# Patient Record
Sex: Female | Born: 1950 | Race: White | Hispanic: No | Marital: Married | Smoking: Never smoker
Health system: Southern US, Community
[De-identification: ages and names within clinical notes are randomized; demographics above are authoritative.]

## PROBLEM LIST (undated history)

## (undated) ENCOUNTER — Ambulatory Visit: Admission: EM | Source: Home / Self Care

## (undated) DIAGNOSIS — M1990 Unspecified osteoarthritis, unspecified site: Secondary | ICD-10-CM

## (undated) DIAGNOSIS — B1920 Unspecified viral hepatitis C without hepatic coma: Secondary | ICD-10-CM

## (undated) DIAGNOSIS — D649 Anemia, unspecified: Secondary | ICD-10-CM

## (undated) DIAGNOSIS — S82001A Unspecified fracture of right patella, initial encounter for closed fracture: Secondary | ICD-10-CM

## (undated) DIAGNOSIS — J302 Other seasonal allergic rhinitis: Secondary | ICD-10-CM

## (undated) DIAGNOSIS — I10 Essential (primary) hypertension: Secondary | ICD-10-CM

## (undated) DIAGNOSIS — E785 Hyperlipidemia, unspecified: Secondary | ICD-10-CM

## (undated) DIAGNOSIS — Z5189 Encounter for other specified aftercare: Secondary | ICD-10-CM

## (undated) DIAGNOSIS — J189 Pneumonia, unspecified organism: Secondary | ICD-10-CM

## (undated) DIAGNOSIS — IMO0001 Reserved for inherently not codable concepts without codable children: Secondary | ICD-10-CM

## (undated) DIAGNOSIS — E079 Disorder of thyroid, unspecified: Secondary | ICD-10-CM

## (undated) DIAGNOSIS — S7291XA Unspecified fracture of right femur, initial encounter for closed fracture: Secondary | ICD-10-CM

## (undated) DIAGNOSIS — T7840XA Allergy, unspecified, initial encounter: Secondary | ICD-10-CM

## (undated) DIAGNOSIS — G709 Myoneural disorder, unspecified: Secondary | ICD-10-CM

## (undated) DIAGNOSIS — K219 Gastro-esophageal reflux disease without esophagitis: Secondary | ICD-10-CM

## (undated) HISTORY — DX: Encounter for other specified aftercare: Z51.89

## (undated) HISTORY — DX: Myoneural disorder, unspecified: G70.9

## (undated) HISTORY — PX: LIVER BIOPSY: SHX301

## (undated) HISTORY — PX: TUBAL LIGATION: SHX77

## (undated) HISTORY — DX: Allergy, unspecified, initial encounter: T78.40XA

## (undated) HISTORY — PX: PARTIAL HYSTERECTOMY: SHX80

## (undated) HISTORY — PX: FRACTURE SURGERY: SHX138

## (undated) HISTORY — DX: Essential (primary) hypertension: I10

## (undated) HISTORY — PX: APPENDECTOMY: SHX54

## (undated) HISTORY — DX: Reserved for inherently not codable concepts without codable children: IMO0001

## (undated) HISTORY — PX: CHOLECYSTECTOMY: SHX55

## (undated) HISTORY — DX: Anemia, unspecified: D64.9

## (undated) HISTORY — DX: Hyperlipidemia, unspecified: E78.5

## (undated) HISTORY — DX: Unspecified viral hepatitis C without hepatic coma: B19.20

## (undated) HISTORY — PX: TONSILLECTOMY AND ADENOIDECTOMY: SUR1326

## (undated) HISTORY — PX: OVARIAN CYST SURGERY: SHX726

## (undated) HISTORY — DX: Gastro-esophageal reflux disease without esophagitis: K21.9

## (undated) HISTORY — PX: OTHER SURGICAL HISTORY: SHX169

## (undated) HISTORY — DX: Unspecified osteoarthritis, unspecified site: M19.90

## (undated) HISTORY — DX: Disorder of thyroid, unspecified: E07.9

---
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
## 1990-09-11 DIAGNOSIS — B1920 Unspecified viral hepatitis C without hepatic coma: Secondary | ICD-10-CM

## 1990-09-11 HISTORY — DX: Unspecified viral hepatitis C without hepatic coma: B19.20

## 1999-05-24 ENCOUNTER — Emergency Department (HOSPITAL_COMMUNITY): Admission: EM | Admit: 1999-05-24 | Discharge: 1999-05-24 | Payer: Self-pay

## 1999-09-06 ENCOUNTER — Other Ambulatory Visit: Admission: RE | Admit: 1999-09-06 | Discharge: 1999-09-06 | Payer: Self-pay | Admitting: General Practice

## 1999-11-13 HISTORY — PX: SPINE SURGERY: SHX786

## 2000-04-19 ENCOUNTER — Ambulatory Visit (HOSPITAL_COMMUNITY): Admission: RE | Admit: 2000-04-19 | Discharge: 2000-04-19 | Payer: Self-pay | Admitting: Gastroenterology

## 2001-05-23 ENCOUNTER — Ambulatory Visit (HOSPITAL_BASED_OUTPATIENT_CLINIC_OR_DEPARTMENT_OTHER): Admission: RE | Admit: 2001-05-23 | Discharge: 2001-05-23 | Payer: Self-pay | Admitting: Surgery

## 2001-08-20 ENCOUNTER — Ambulatory Visit (HOSPITAL_COMMUNITY): Admission: RE | Admit: 2001-08-20 | Discharge: 2001-08-20 | Payer: Self-pay | Admitting: Gastroenterology

## 2002-07-25 ENCOUNTER — Encounter: Payer: Self-pay | Admitting: Emergency Medicine

## 2002-07-25 ENCOUNTER — Emergency Department (HOSPITAL_COMMUNITY): Admission: EM | Admit: 2002-07-25 | Discharge: 2002-07-25 | Payer: Self-pay | Admitting: Emergency Medicine

## 2003-08-25 ENCOUNTER — Encounter: Admission: RE | Admit: 2003-08-25 | Discharge: 2003-08-25 | Payer: Self-pay | Admitting: Internal Medicine

## 2005-03-15 ENCOUNTER — Ambulatory Visit (HOSPITAL_COMMUNITY): Admission: RE | Admit: 2005-03-15 | Discharge: 2005-03-16 | Payer: Self-pay | Admitting: Neurosurgery

## 2005-09-28 ENCOUNTER — Encounter: Admission: RE | Admit: 2005-09-28 | Discharge: 2005-12-27 | Payer: Self-pay | Admitting: Neurosurgery

## 2006-01-12 ENCOUNTER — Ambulatory Visit (HOSPITAL_COMMUNITY): Admission: RE | Admit: 2006-01-12 | Discharge: 2006-01-12 | Payer: Self-pay | Admitting: Neurosurgery

## 2010-10-02 ENCOUNTER — Encounter: Payer: Self-pay | Admitting: Neurosurgery

## 2011-02-09 ENCOUNTER — Encounter: Payer: Self-pay | Admitting: Internal Medicine

## 2011-02-09 ENCOUNTER — Ambulatory Visit (INDEPENDENT_AMBULATORY_CARE_PROVIDER_SITE_OTHER): Payer: BC Managed Care – PPO | Admitting: Internal Medicine

## 2011-02-09 VITALS — BP 130/88 | HR 68 | Temp 97.9°F | Ht 63.0 in | Wt 199.0 lb

## 2011-02-09 DIAGNOSIS — R05 Cough: Secondary | ICD-10-CM

## 2011-02-09 MED ORDER — OMEPRAZOLE-SODIUM BICARBONATE 20-1100 MG PO CAPS: 1.0000 | ORAL_CAPSULE | Freq: Every day | ORAL | Status: DC

## 2011-02-09 MED ORDER — BECLOMETHASONE DIPROPIONATE 80 MCG/ACT IN AERS: 1.0000 | INHALATION_SPRAY | Freq: Two times a day (BID) | RESPIRATORY_TRACT | Status: DC

## 2011-02-09 NOTE — Progress Notes (Signed)
Subjective:    Patient ID: Suzanne Barton, female    DOB: [birth year], 60 y.o.   MRN: [id number]  Cough This is a new (60 year old obese female. ) problem. The current episode started more than 1 month ago (In Dec 2011 son got sick and she got sick with cold, cough but states cough never went away affter that). The problem has been gradually worsening (By March 2012 cough wsa getting worse, she was coughing all the time. In March 2012 zpak tried but no help. In April 2012, 4 day pred burst and expectt  but no better. Later in April - Rx with biazxin & tessalon perles. Early May 2012: 10 d pred & avelox ). The problem occurs constantly (Cough slowly getting better this past week). The cough is non-productive (dry, hacking cough, severe). Pertinent negatives include no chest pain, chills, ear congestion, ear pain, eye redness, fever, headaches, heartburn, hemoptysis, myalgias, postnasal drip, rash, rhinorrhea, sore throat, shortness of breath, sweats, weight loss or wheezing. Associated symptoms comments: [facility] April 2012 [facility] normal per hx. No associated ACE inhibitor intake. Associated "constant tickle" in throat present but denies dyspnea, heartburn, wheezing, chest tightness, post nasal drip or constant clearing of throat. Hx of neck sx 2004 and hx of spasms on and off in neck +. The symptoms are aggravated by fumes (Feeling hot, hot environment makes cough worse. Cold air does not bother cough. Has spring allergy but this is not making cough worse). She has tried oral steroids, OTC cough suppressant, prescription cough suppressant and rest for the symptoms. The treatment provided mild (Cough somewhat better past 2 days) relief. Her past medical history is significant for asthma and environmental allergies. There is no history of bronchiectasis, bronchitis, COPD, emphysema or pneumonia. allergic tree pollen but this is not causing cough,. Has hx of hiatal hernia but states she follows dietary  monitoring and portion control. Denies post nasal drip or sinus  issues. Known Hep C  - s/p IFN and Ribarvarin  early 2002      Review of Systems  Constitutional: Negative for fever, chills, weight loss and unexpected weight change.  HENT: Negative for ear pain, nosebleeds, congestion, sore throat, rhinorrhea, sneezing, trouble swallowing, dental problem, postnasal drip and sinus pressure.   Eyes: Negative for redness and itching.  Respiratory: Positive for cough. Negative for hemoptysis, chest tightness, shortness of breath and wheezing.   Cardiovascular: Negative for chest pain, palpitations and leg swelling.  Gastrointestinal: Negative for heartburn, nausea and vomiting.  Genitourinary: Negative for dysuria.  Musculoskeletal: Negative for myalgias and joint swelling.  Skin: Negative for rash.  Neurological: Negative for headaches.  Hematological: Positive for environmental allergies. Does not bruise/bleed easily.  Psychiatric/Behavioral: Negative for dysphoric mood. The patient is not nervous/anxious.        Objective:   Physical Exam  [vitalsreviewed. Constitutional: She is oriented to person, place, and time. She appears well-developed and well-nourished. No distress.       obese  HENT:  Head: Normocephalic and atraumatic.  Right Ear: External ear normal.  Left Ear: External ear normal.  Mouth/Throat: Oropharynx is clear and moist. No oropharyngeal exudate.  Eyes: Conjunctivae and EOM are normal. Pupils are equal, round, and reactive to light. Right eye exhibits no discharge. Left eye exhibits no discharge. No scleral icterus.  Neck: Normal range of motion. Neck supple. No JVD present. No tracheal deviation present. No thyromegaly present.  Cardiovascular: Normal rate, regular rhythm, normal heart sounds and intact distal  pulses.  Exam reveals no gallop and no friction rub.   No murmur heard. Pulmonary/Chest: Effort normal and breath sounds normal. No respiratory distress. She  has no wheezes. She has no rales. She exhibits no tenderness.  Abdominal: Soft. Bowel sounds are normal. She exhibits no distension and no mass. There is no tenderness. There is no rebound and no guarding.  Musculoskeletal: Normal range of motion. She exhibits no edema and no tenderness.  Lymphadenopathy:    She has no cervical adenopathy.  Neurological: She is alert and oriented to person, place, and time. She has normal reflexes. No cranial nerve deficit. She exhibits normal muscle tone. Coordination normal.  Skin: Skin is warm and dry. No rash noted. She is not diaphoretic. No erythema. No pallor.  Psychiatric: She has a normal mood and affect. Her behavior is normal. Judgment and thought content normal.          Assessment & Plan:

## 2011-02-09 NOTE — Patient Instructions (Addendum)
You cough is possibly  from acid reflux, sinus drainage, and possibly asthma # for acid reflux  - take diet sheet  - take otc zegerid 20mg  once daily on empty stomach; my nurse will do the order for this #for possible sinus   - use netti pot; Victorino Dike will show you how it works #possible asthma  - take qvar 2 puff bid - take 2 samples, learn technique #Followup  - 4-5 weeks

## 2011-02-13 DIAGNOSIS — R053 Chronic cough: Secondary | ICD-10-CM | POA: Insufficient documentation

## 2011-02-13 DIAGNOSIS — R05 Cough: Secondary | ICD-10-CM | POA: Insufficient documentation

## 2011-02-13 NOTE — Assessment & Plan Note (Signed)
You cough is possibly  from acid reflux, sinus drainage, and possibly asthma # for acid reflux  - take diet sheet  - take otc zegerid 20mg  once daily on empty stomach; my nurse will do the order for this #for possible sinus   - use netti pot; Victorino Dike will show you how it works #possible asthma (she prefers empiric RX as opposed to testing)  - take qvar 2 puff bid - take 2 samples, learn technique #Followup  - 4-5 weeks

## 2011-03-13 ENCOUNTER — Ambulatory Visit: Payer: BC Managed Care – PPO | Admitting: Internal Medicine

## 2012-02-20 ENCOUNTER — Encounter: Payer: Self-pay | Admitting: Internal Medicine

## 2012-02-27 ENCOUNTER — Encounter (INDEPENDENT_AMBULATORY_CARE_PROVIDER_SITE_OTHER): Payer: Self-pay

## 2012-03-08 ENCOUNTER — Ambulatory Visit (AMBULATORY_SURGERY_CENTER): Payer: BC Managed Care – PPO | Admitting: *Deleted

## 2012-03-08 ENCOUNTER — Encounter: Payer: Self-pay | Admitting: Internal Medicine

## 2012-03-08 VITALS — Ht 62.5 in | Wt 196.4 lb

## 2012-03-08 DIAGNOSIS — Z1211 Encounter for screening for malignant neoplasm of colon: Secondary | ICD-10-CM

## 2012-03-08 MED ORDER — MOVIPREP 100 G PO SOLR: ORAL | Status: DC

## 2012-03-08 NOTE — Progress Notes (Signed)
No allergies to eggs or soy products 

## 2012-03-19 NOTE — Addendum Note (Signed)
Addended by: Maple Hudson on: 03/19/2012 11:29 AM   Modules accepted: Level of Service

## 2012-03-21 ENCOUNTER — Ambulatory Visit (AMBULATORY_SURGERY_CENTER): Payer: BC Managed Care – PPO | Admitting: Internal Medicine

## 2012-03-21 ENCOUNTER — Encounter: Payer: Self-pay | Admitting: Internal Medicine

## 2012-03-21 VITALS — BP 144/70 | HR 86 | Temp 96.5°F | Resp 20 | Ht 62.0 in | Wt 196.0 lb

## 2012-03-21 DIAGNOSIS — D126 Benign neoplasm of colon, unspecified: Secondary | ICD-10-CM

## 2012-03-21 DIAGNOSIS — Z1211 Encounter for screening for malignant neoplasm of colon: Secondary | ICD-10-CM

## 2012-03-21 MED ORDER — SODIUM CHLORIDE 0.9 % IV SOLN: 500.0000 mL | INTRAVENOUS | Status: DC

## 2012-03-21 NOTE — Patient Instructions (Addendum)

## 2012-03-21 NOTE — Progress Notes (Signed)
1211- heart rate down to 27.  Dr. Marina Goodell aware.  Robinul 0.2mg  IV given per Silverio Decamp CRNA.  BP 68/44.  HOB down and IVF wide open  1215- Heart rate 70, BP 107/55 now

## 2012-03-21 NOTE — Progress Notes (Signed)
Patient did not experience any of the following events: a burn prior to discharge; a fall within the facility; wrong site/side/patient/procedure/implant event; or a hospital transfer or hospital admission upon discharge from the facility. (G8907) Patient did not have preoperative order for IV antibiotic SSI prophylaxis. (G8918)  

## 2012-03-21 NOTE — Op Note (Signed)
[facility] [address] [place], [place]  [address]  COLONOSCOPY PROCEDURE REPORT  PATIENT:  Suzanne Barton, Suzanne Barton  MR#:  [id number] BIRTHDATE:  1951/04/29, 60 yrs. old  GENDER:  female ENDOSCOPIST:  Wilhemina Bonito. Eda Keys, MD REF. BY:  Screening / Recall PROCEDURE DATE:  03/21/2012 PROCEDURE:  Colonoscopy with snare polypectomy x 2 ASA CLASS:  Class II INDICATIONS:  Screening ; colon / ACBE 2004 negative. c/o "air pockets, cramps, change bowels x 2 yrs. ?IBS" MEDICATIONS:   MAC sedation, administered by CRNA, propofol (Diprivan) 250 mg IV  DESCRIPTION OF PROCEDURE:   After the risks benefits and alternatives of the procedure were thoroughly explained, informed consent was obtained.  Digital rectal exam was performed and revealed no abnormalities.   The LB CF-H180AL [id number] endoscope was introduced through the anus and advanced to the cecum, which was identified by both the appendix and ileocecal valve, without limitations.  The quality of the prep was excellent, using MoviPrep.  The instrument was then slowly withdrawn as the colon was fully examined. <<PROCEDUREIMAGES>>  FINDINGS:  Two diminutive polyps were found in the rectum and sigmoid colon  were snared without cautery. Retrieval was successful.  Otherwise normal colonoscopy without other polyps, masses, vascular ectasias, or inflammatory changes.   Retroflexed views in the rectum revealed internal hemorrhoids.    The time to cecum =  4:00  minutes. The scope was then withdrawn in  13:10 minutes from the cecum and the procedure completed.  COMPLICATIONS:  None  ENDOSCOPIC IMPRESSION: 1) Two polyps in the rectum and sigmoid colon - removed 2) Otherwise normal colonoscopy 3) Internal hemorrhoids  RECOMMENDATIONS: 1) Repeat colonoscopy in 5 years if polyp adenomatous; otherwise 10 years  ______________________________ Wilhemina Bonito. Eda Keys, MD  CC:  The Patient; Charleston Poot, MD  n. Rosalie Doctor:   Wilhemina Bonito. Eda Keys at 03/21/2012 12:34 PM  [place], Elease Hashimoto, [id number]

## 2012-03-22 ENCOUNTER — Telehealth: Payer: Self-pay | Admitting: *Deleted

## 2012-03-22 NOTE — Telephone Encounter (Signed)
  Follow up Call-  Call back number 03/21/2012  Post procedure Call Back phone  # [phone]  Permission to leave phone message Yes     Patient questions:  Do you have a fever, pain , or abdominal swelling? no Pain Score  0 *  Have you tolerated food without any problems? yes  Have you been able to return to your normal activities? yes  Do you have any questions about your discharge instructions: Diet   no Medications  no Follow up visit  no  Do you have questions or concerns about your Care? no  Actions: * If pain score is 4 or above: No action needed, pain <4.

## 2012-03-26 ENCOUNTER — Encounter: Payer: Self-pay | Admitting: Internal Medicine

## 2012-04-29 ENCOUNTER — Encounter (INDEPENDENT_AMBULATORY_CARE_PROVIDER_SITE_OTHER): Payer: Self-pay | Admitting: Surgery

## 2012-04-29 ENCOUNTER — Ambulatory Visit (INDEPENDENT_AMBULATORY_CARE_PROVIDER_SITE_OTHER): Payer: BC Managed Care – PPO | Admitting: Surgery

## 2012-04-29 VITALS — BP 160/78 | HR 71 | Temp 98.1°F | Ht 63.0 in | Wt 194.8 lb

## 2012-04-29 DIAGNOSIS — M79621 Pain in right upper arm: Secondary | ICD-10-CM

## 2012-04-29 DIAGNOSIS — M79609 Pain in unspecified limb: Secondary | ICD-10-CM

## 2012-04-29 NOTE — Patient Instructions (Signed)
We will arrange an ultrasound to look at the area in the right axilla that is causing discomfort

## 2012-04-29 NOTE — Progress Notes (Signed)
Chief complaint: Painful area in right axilla, question of a mass  History of present illness: I taken to lipomas all from this patient's axillary area but they were really distal towards the arm.Currently she feels an area over but lower down in the actual axilla. She says that when his aches and sometimes makes the upper-outer quadrant of her right breast ache. It is not clear that she can actually feel a mass. She mentioned this when she had her mammogram in June and they apparently did some extra views but did not see a mass. An ultrasound was not done.  Past history family history and review of systems are all in Epic and not redictated here. I have reviewed them.  Exam: Vital signs:BP 160/78  Pulse 71  Temp 98.1 F (36.7 C) (Temporal)  Ht 5\' 3"  (1.6 m)  Wt 194 lb 12.8 oz (88.361 kg)  BMI 34.51 kg/m2  SpO2 98% Gen.: Patient alert oriented healthy-appearing Axilla: She has some tenderness in the low axilla on the right. I do not feel a definite mass although there may be some lipomatous type changes. There is no evidence of adenopathy or any other significant pathology in the axilla.   Data reviewed:Mammogram report is noted.  Impression: Right axillary pain with question of a lipoma or other mass; Negative mammogram  Plan: I think the first it would be to do an ultrasound to see if there is a mass. A total hesitant to excise any of this tissue without knowing there is some mass even if it's just a lipoma. She thought that his oral lipoma of the left lung but she is somewhat symptomatic.  Once the ultrasound is done we can make further plans.

## 2012-10-11 ENCOUNTER — Encounter (INDEPENDENT_AMBULATORY_CARE_PROVIDER_SITE_OTHER): Payer: Self-pay

## 2015-02-24 ENCOUNTER — Encounter: Payer: Self-pay | Admitting: Internal Medicine

## 2016-02-10 ENCOUNTER — Inpatient Hospital Stay (HOSPITAL_COMMUNITY): Payer: BLUE CROSS/BLUE SHIELD | Admitting: Anesthesiology

## 2016-02-10 ENCOUNTER — Inpatient Hospital Stay (HOSPITAL_COMMUNITY): Payer: BLUE CROSS/BLUE SHIELD

## 2016-02-10 ENCOUNTER — Encounter (HOSPITAL_COMMUNITY)
Admission: EM | Disposition: A | Discharge status: Home Health | Payer: Self-pay | Source: Home / Self Care | Attending: Orthopedic Surgery

## 2016-02-10 ENCOUNTER — Encounter (HOSPITAL_COMMUNITY): Payer: Self-pay | Admitting: Emergency Medicine

## 2016-02-10 ENCOUNTER — Emergency Department (HOSPITAL_COMMUNITY): Payer: BLUE CROSS/BLUE SHIELD

## 2016-02-10 ENCOUNTER — Inpatient Hospital Stay (HOSPITAL_COMMUNITY)
Admission: EM | Admit: 2016-02-10 | Discharge: 2016-02-13 | DRG: 481 | Disposition: A | Discharge status: Home Health | Payer: BLUE CROSS/BLUE SHIELD | Attending: Orthopedic Surgery | Admitting: Orthopedic Surgery

## 2016-02-10 ENCOUNTER — Ambulatory Visit: Admit: 2016-02-10 | Payer: Self-pay | Admitting: Orthopedic Surgery

## 2016-02-10 DIAGNOSIS — Z801 Family history of malignant neoplasm of trachea, bronchus and lung: Secondary | ICD-10-CM

## 2016-02-10 DIAGNOSIS — Z888 Allergy status to other drugs, medicaments and biological substances status: Secondary | ICD-10-CM | POA: Diagnosis not present

## 2016-02-10 DIAGNOSIS — Y9301 Activity, walking, marching and hiking: Secondary | ICD-10-CM | POA: Diagnosis present

## 2016-02-10 DIAGNOSIS — K219 Gastro-esophageal reflux disease without esophagitis: Secondary | ICD-10-CM | POA: Diagnosis present

## 2016-02-10 DIAGNOSIS — Z881 Allergy status to other antibiotic agents status: Secondary | ICD-10-CM | POA: Diagnosis not present

## 2016-02-10 DIAGNOSIS — Z882 Allergy status to sulfonamides status: Secondary | ICD-10-CM

## 2016-02-10 DIAGNOSIS — W19XXXA Unspecified fall, initial encounter: Secondary | ICD-10-CM

## 2016-02-10 DIAGNOSIS — S72301A Unspecified fracture of shaft of right femur, initial encounter for closed fracture: Principal | ICD-10-CM | POA: Diagnosis present

## 2016-02-10 DIAGNOSIS — B182 Chronic viral hepatitis C: Secondary | ICD-10-CM | POA: Diagnosis present

## 2016-02-10 DIAGNOSIS — Z981 Arthrodesis status: Secondary | ICD-10-CM | POA: Diagnosis not present

## 2016-02-10 DIAGNOSIS — W108XXA Fall (on) (from) other stairs and steps, initial encounter: Secondary | ICD-10-CM | POA: Diagnosis present

## 2016-02-10 DIAGNOSIS — E785 Hyperlipidemia, unspecified: Secondary | ICD-10-CM | POA: Diagnosis present

## 2016-02-10 DIAGNOSIS — Z880 Allergy status to penicillin: Secondary | ICD-10-CM | POA: Diagnosis not present

## 2016-02-10 DIAGNOSIS — S7291XA Unspecified fracture of right femur, initial encounter for closed fracture: Secondary | ICD-10-CM

## 2016-02-10 DIAGNOSIS — D62 Acute posthemorrhagic anemia: Secondary | ICD-10-CM | POA: Diagnosis not present

## 2016-02-10 DIAGNOSIS — S72001A Fracture of unspecified part of neck of right femur, initial encounter for closed fracture: Secondary | ICD-10-CM

## 2016-02-10 DIAGNOSIS — Z419 Encounter for procedure for purposes other than remedying health state, unspecified: Secondary | ICD-10-CM

## 2016-02-10 HISTORY — PX: FEMUR IM NAIL: SHX1597

## 2016-02-10 HISTORY — DX: Unspecified fracture of right femur, initial encounter for closed fracture: S72.91XA

## 2016-02-10 LAB — CBC WITH DIFFERENTIAL/PLATELET
Basophils Absolute: 0 10*3/uL (ref 0.0–0.1)
Basophils Relative: 0 %
Eosinophils Absolute: 0.1 10*3/uL (ref 0.0–0.7)
Eosinophils Relative: 1 %
HEMATOCRIT: 37.5 % (ref 36.0–46.0)
HEMOGLOBIN: 12.3 g/dL (ref 12.0–15.0)
LYMPHS ABS: 1.2 10*3/uL (ref 0.7–4.0)
Lymphocytes Relative: 22 %
MCH: 28.9 pg (ref 26.0–34.0)
MCHC: 32.8 g/dL (ref 30.0–36.0)
MCV: 88 fL (ref 78.0–100.0)
MONO ABS: 0.4 10*3/uL (ref 0.1–1.0)
MONOS PCT: 7 %
NEUTROS PCT: 70 %
Neutro Abs: 3.7 10*3/uL (ref 1.7–7.7)
Platelets: 184 10*3/uL (ref 150–400)
RBC: 4.26 MIL/uL (ref 3.87–5.11)
RDW: 13.1 % (ref 11.5–15.5)
WBC: 5.3 10*3/uL (ref 4.0–10.5)

## 2016-02-10 LAB — BASIC METABOLIC PANEL
Anion gap: 6 (ref 5–15)
BUN: 6 mg/dL (ref 6–20)
CHLORIDE: 105 mmol/L (ref 101–111)
CO2: 28 mmol/L (ref 22–32)
Calcium: 9.1 mg/dL (ref 8.9–10.3)
Creatinine, Ser: 1.03 mg/dL — ABNORMAL HIGH (ref 0.44–1.00)
GFR calc Af Amer: >60 mL/min (ref >60)
GFR calc non Af Amer: 57 mL/min — ABNORMAL LOW (ref >60)
GLUCOSE: 130 mg/dL — AB (ref 65–99)
POTASSIUM: 3.8 mmol/L (ref 3.5–5.1)
SODIUM: 139 mmol/L (ref 135–145)

## 2016-02-10 SURGERY — INSERTION, INTRAMEDULLARY ROD, FEMUR
Anesthesia: Monitor Anesthesia Care | Site: Leg Upper | Laterality: Right

## 2016-02-10 MED ORDER — LACTATED RINGERS IV SOLN
INTRAVENOUS | Status: DC | PRN
  Administered 2016-02-10 (×3): via INTRAVENOUS

## 2016-02-10 MED ORDER — SENNA-DOCUSATE SODIUM 8.6-50 MG PO TABS: 2.0000 | ORAL_TABLET | Freq: Every day | ORAL | Status: DC

## 2016-02-10 MED ORDER — ACETAMINOPHEN 325 MG PO TABS: 650.0000 mg | ORAL_TABLET | Freq: Four times a day (QID) | ORAL | Status: DC | PRN

## 2016-02-10 MED ORDER — HYDROMORPHONE HCL 1 MG/ML IJ SOLN: 0.2500 mg | INTRAMUSCULAR | Status: DC | PRN

## 2016-02-10 MED ORDER — MECLIZINE HCL 25 MG PO TABS
25.0000 mg | ORAL_TABLET | Freq: Three times a day (TID) | ORAL | Status: DC | PRN
  Filled 2016-02-10: qty 1

## 2016-02-10 MED ORDER — ONDANSETRON HCL 4 MG/2ML IJ SOLN
4.0000 mg | Freq: Four times a day (QID) | INTRAMUSCULAR | Status: DC | PRN
  Administered 2016-02-11 – 2016-02-12 (×2): 4 mg via INTRAVENOUS
  Filled 2016-02-10 (×2): qty 2

## 2016-02-10 MED ORDER — HYDROMORPHONE HCL 1 MG/ML IJ SOLN: 1.0000 mg | INTRAMUSCULAR | Status: DC | PRN

## 2016-02-10 MED ORDER — ACETAMINOPHEN 650 MG RE SUPP: 650.0000 mg | Freq: Four times a day (QID) | RECTAL | Status: DC | PRN

## 2016-02-10 MED ORDER — SENNA 8.6 MG PO TABS
1.0000 | ORAL_TABLET | Freq: Two times a day (BID) | ORAL | Status: DC
  Administered 2016-02-10 – 2016-02-13 (×6): 8.6 mg via ORAL
  Filled 2016-02-10 (×6): qty 1

## 2016-02-10 MED ORDER — ONDANSETRON HCL 4 MG/2ML IJ SOLN
INTRAMUSCULAR | Status: DC | PRN
  Administered 2016-02-10: 4 mg via INTRAVENOUS

## 2016-02-10 MED ORDER — ACETAMINOPHEN 160 MG/5ML PO SOLN
325.0000 mg | ORAL | Status: DC | PRN
Start: 2016-02-10 — End: 2016-02-10
  Filled 2016-02-10: qty 20.3

## 2016-02-10 MED ORDER — DIPHENHYDRAMINE HCL (SLEEP) 25 MG PO TABS: 25.0000 mg | ORAL_TABLET | ORAL | Status: DC | PRN

## 2016-02-10 MED ORDER — PANTOPRAZOLE SODIUM 40 MG PO TBEC
40.0000 mg | DELAYED_RELEASE_TABLET | Freq: Every day | ORAL | Status: DC
  Administered 2016-02-11 – 2016-02-13 (×3): 40 mg via ORAL
  Filled 2016-02-10 (×3): qty 1

## 2016-02-10 MED ORDER — MAGNESIUM CITRATE PO SOLN: 1.0000 | Freq: Once | ORAL | Status: DC | PRN

## 2016-02-10 MED ORDER — CEFAZOLIN SODIUM-DEXTROSE 2-4 GM/100ML-% IV SOLN
INTRAVENOUS | Status: AC
  Administered 2016-02-10: 2 g via INTRAVENOUS
  Filled 2016-02-10: qty 100

## 2016-02-10 MED ORDER — PROPOFOL 500 MG/50ML IV EMUL
INTRAVENOUS | Status: DC | PRN
  Administered 2016-02-10: 50 ug/kg/min via INTRAVENOUS

## 2016-02-10 MED ORDER — CEFAZOLIN SODIUM-DEXTROSE 2-4 GM/100ML-% IV SOLN
2.0000 g | Freq: Four times a day (QID) | INTRAVENOUS | Status: AC
  Administered 2016-02-10 – 2016-02-11 (×2): 2 g via INTRAVENOUS
  Filled 2016-02-10 (×2): qty 100

## 2016-02-10 MED ORDER — 0.9 % SODIUM CHLORIDE (POUR BTL) OPTIME
TOPICAL | Status: DC | PRN
  Administered 2016-02-10: 1000 mL

## 2016-02-10 MED ORDER — FENTANYL CITRATE (PF) 100 MCG/2ML IJ SOLN: 100.0000 ug | Freq: Once | INTRAMUSCULAR | Status: DC

## 2016-02-10 MED ORDER — PHENYLEPHRINE HCL 10 MG/ML IJ SOLN
INTRAMUSCULAR | Status: DC | PRN
  Administered 2016-02-10 (×7): 80 ug via INTRAVENOUS

## 2016-02-10 MED ORDER — PROPOFOL 1000 MG/100ML IV EMUL
INTRAVENOUS | Status: AC
  Filled 2016-02-10: qty 100

## 2016-02-10 MED ORDER — TRAMADOL HCL 50 MG PO TABS
50.0000 mg | ORAL_TABLET | Freq: Four times a day (QID) | ORAL | Status: DC | PRN
  Administered 2016-02-11 – 2016-02-12 (×3): 50 mg via ORAL
  Filled 2016-02-10 (×3): qty 1

## 2016-02-10 MED ORDER — FERROUS SULFATE 325 (65 FE) MG PO TABS
325.0000 mg | ORAL_TABLET | Freq: Three times a day (TID) | ORAL | Status: DC
  Administered 2016-02-11 – 2016-02-13 (×7): 325 mg via ORAL
  Filled 2016-02-10 (×7): qty 1

## 2016-02-10 MED ORDER — ALUM & MAG HYDROXIDE-SIMETH 200-200-20 MG/5ML PO SUSP: 30.0000 mL | ORAL | Status: DC | PRN

## 2016-02-10 MED ORDER — ENOXAPARIN SODIUM 40 MG/0.4ML ~~LOC~~ SOLN
40.0000 mg | SUBCUTANEOUS | Status: DC
Start: 2016-02-11 — End: 2016-02-13
  Administered 2016-02-11 – 2016-02-13 (×3): 40 mg via SUBCUTANEOUS
  Filled 2016-02-10 (×3): qty 0.4

## 2016-02-10 MED ORDER — CYCLOBENZAPRINE HCL 10 MG PO TABS: 10.0000 mg | ORAL_TABLET | Freq: Three times a day (TID) | ORAL | Status: DC | PRN

## 2016-02-10 MED ORDER — OXYCODONE HCL 5 MG/5ML PO SOLN: 5.0000 mg | Freq: Once | ORAL | Status: DC | PRN

## 2016-02-10 MED ORDER — PROPOFOL 10 MG/ML IV BOLUS
INTRAVENOUS | Status: DC | PRN
  Administered 2016-02-10: 50 mg via INTRAVENOUS
  Administered 2016-02-10: 25 mg via INTRAVENOUS
  Administered 2016-02-10: 50 mg via INTRAVENOUS
  Administered 2016-02-10: 25 mg via INTRAVENOUS

## 2016-02-10 MED ORDER — PHENYLEPHRINE 40 MCG/ML (10ML) SYRINGE FOR IV PUSH (FOR BLOOD PRESSURE SUPPORT)
PREFILLED_SYRINGE | INTRAVENOUS | Status: AC
  Filled 2016-02-10: qty 10

## 2016-02-10 MED ORDER — HYDROMORPHONE HCL 1 MG/ML IJ SOLN: 0.5000 mg | INTRAMUSCULAR | Status: DC | PRN

## 2016-02-10 MED ORDER — HYDROCODONE-ACETAMINOPHEN 10-325 MG PO TABS: 1.0000 | ORAL_TABLET | Freq: Four times a day (QID) | ORAL | Status: DC | PRN

## 2016-02-10 MED ORDER — ONDANSETRON HCL 4 MG PO TABS: 4.0000 mg | ORAL_TABLET | Freq: Four times a day (QID) | ORAL | Status: DC | PRN

## 2016-02-10 MED ORDER — PHENOL 1.4 % MT LIQD
1.0000 | OROMUCOSAL | Status: DC | PRN
  Filled 2016-02-10: qty 177

## 2016-02-10 MED ORDER — EPHEDRINE 5 MG/ML INJ
INTRAVENOUS | Status: AC
  Filled 2016-02-10: qty 10

## 2016-02-10 MED ORDER — DIPHENHYDRAMINE HCL 25 MG PO CAPS: 25.0000 mg | ORAL_CAPSULE | Freq: Every evening | ORAL | Status: DC | PRN

## 2016-02-10 MED ORDER — SODIUM CHLORIDE 0.9 % IV SOLN
75.0000 mL/h | INTRAVENOUS | Status: DC
Start: 2016-02-10 — End: 2016-02-13

## 2016-02-10 MED ORDER — EPHEDRINE SULFATE 50 MG/ML IJ SOLN
INTRAMUSCULAR | Status: DC | PRN
  Administered 2016-02-10 (×3): 5 mg via INTRAVENOUS
  Administered 2016-02-10: 10 mg via INTRAVENOUS

## 2016-02-10 MED ORDER — MIDAZOLAM HCL 5 MG/5ML IJ SOLN
INTRAMUSCULAR | Status: DC | PRN
  Administered 2016-02-10: 2 mg via INTRAVENOUS

## 2016-02-10 MED ORDER — PROPOFOL 10 MG/ML IV BOLUS
INTRAVENOUS | Status: AC
  Filled 2016-02-10: qty 40

## 2016-02-10 MED ORDER — ACETAMINOPHEN 325 MG PO TABS
325.0000 mg | ORAL_TABLET | ORAL | Status: DC | PRN
Start: 2016-02-10 — End: 2016-02-10

## 2016-02-10 MED ORDER — DOCUSATE SODIUM 100 MG PO CAPS
100.0000 mg | ORAL_CAPSULE | Freq: Two times a day (BID) | ORAL | Status: DC
  Administered 2016-02-10 – 2016-02-13 (×6): 100 mg via ORAL
  Filled 2016-02-10 (×6): qty 1

## 2016-02-10 MED ORDER — BUPIVACAINE HCL (PF) 0.5 % IJ SOLN
INTRAMUSCULAR | Status: DC | PRN
  Administered 2016-02-10: 2.8 mL via INTRATHECAL

## 2016-02-10 MED ORDER — HYDROCODONE-ACETAMINOPHEN 5-325 MG PO TABS
1.0000 | ORAL_TABLET | Freq: Four times a day (QID) | ORAL | Status: DC | PRN
  Administered 2016-02-10 – 2016-02-13 (×6): 2 via ORAL
  Filled 2016-02-10 (×6): qty 2

## 2016-02-10 MED ORDER — BUDESONIDE 0.25 MG/2ML IN SUSP
0.2500 mg | Freq: Two times a day (BID) | RESPIRATORY_TRACT | Status: DC
  Administered 2016-02-11 – 2016-02-13 (×5): 0.25 mg via RESPIRATORY_TRACT
  Filled 2016-02-10 (×5): qty 2

## 2016-02-10 MED ORDER — SODIUM CHLORIDE 0.9 % IJ SOLN
INTRAMUSCULAR | Status: AC
  Filled 2016-02-10: qty 10

## 2016-02-10 MED ORDER — ALPRAZOLAM 0.25 MG PO TABS: 0.2500 mg | ORAL_TABLET | Freq: Every evening | ORAL | Status: DC | PRN

## 2016-02-10 MED ORDER — FENTANYL CITRATE (PF) 250 MCG/5ML IJ SOLN
INTRAMUSCULAR | Status: AC
  Filled 2016-02-10: qty 5

## 2016-02-10 MED ORDER — MENTHOL 3 MG MT LOZG
1.0000 | LOZENGE | OROMUCOSAL | Status: DC | PRN
  Filled 2016-02-10: qty 9

## 2016-02-10 MED ORDER — SODIUM CHLORIDE 0.9 % IV SOLN: INTRAVENOUS | Status: DC

## 2016-02-10 MED ORDER — BENZONATATE 100 MG PO CAPS
100.0000 mg | ORAL_CAPSULE | Freq: Three times a day (TID) | ORAL | Status: DC | PRN
  Administered 2016-02-10: 100 mg via ORAL
  Filled 2016-02-10 (×2): qty 1

## 2016-02-10 MED ORDER — ADULT MULTIVITAMIN W/MINERALS CH
1.0000 | ORAL_TABLET | Freq: Every day | ORAL | Status: DC
  Administered 2016-02-11 – 2016-02-13 (×3): 1 via ORAL
  Filled 2016-02-10 (×3): qty 1

## 2016-02-10 MED ORDER — ENOXAPARIN SODIUM 40 MG/0.4ML ~~LOC~~ SOLN
40.0000 mg | SUBCUTANEOUS | Status: DC
Start: 2016-02-10 — End: 2019-03-03

## 2016-02-10 MED ORDER — FENTANYL CITRATE (PF) 250 MCG/5ML IJ SOLN
INTRAMUSCULAR | Status: DC | PRN
  Administered 2016-02-10 (×2): 50 ug via INTRAVENOUS

## 2016-02-10 MED ORDER — ONDANSETRON HCL 4 MG/2ML IJ SOLN: 4.0000 mg | Freq: Three times a day (TID) | INTRAMUSCULAR | Status: DC | PRN

## 2016-02-10 MED ORDER — HYDROMORPHONE HCL 1 MG/ML IJ SOLN
1.0000 mg | Freq: Once | INTRAMUSCULAR | Status: AC
  Administered 2016-02-10: 1 mg via INTRAVENOUS
  Filled 2016-02-10: qty 1

## 2016-02-10 MED ORDER — OXYCODONE HCL 5 MG PO TABS: 5.0000 mg | ORAL_TABLET | Freq: Once | ORAL | Status: DC | PRN

## 2016-02-10 MED ORDER — BISACODYL 10 MG RE SUPP: 10.0000 mg | Freq: Every day | RECTAL | Status: DC | PRN

## 2016-02-10 MED ORDER — CYCLOBENZAPRINE HCL 10 MG PO TABS
10.0000 mg | ORAL_TABLET | Freq: Three times a day (TID) | ORAL | Status: DC | PRN
  Administered 2016-02-10 – 2016-02-13 (×4): 10 mg via ORAL
  Filled 2016-02-10 (×5): qty 1

## 2016-02-10 MED ORDER — POLYETHYLENE GLYCOL 3350 17 G PO PACK: 17.0000 g | PACK | Freq: Every day | ORAL | Status: DC | PRN

## 2016-02-10 MED ORDER — MIDAZOLAM HCL 2 MG/2ML IJ SOLN
INTRAMUSCULAR | Status: AC
  Filled 2016-02-10: qty 2

## 2016-02-10 SURGICAL SUPPLY — 44 items
BENZOIN TINCTURE PRP APPL 2/3 (GAUZE/BANDAGES/DRESSINGS) ×4 IMPLANT
BIT DRILL 4.2 (DRILL) ×2 IMPLANT
BLADE HELICAL TFNA 90 HIP (Anchor) ×3 IMPLANT
BLADE HELICAL TFNA 90MM HIP (Anchor) ×1 IMPLANT
BOOTCOVER CLEANROOM LRG (PROTECTIVE WEAR) ×8 IMPLANT
CLOSURE STERI-STRIP 1/2X4 (GAUZE/BANDAGES/DRESSINGS)
CLOSURE WOUND 1/2 X4 (GAUZE/BANDAGES/DRESSINGS) ×1
CLSR STERI-STRIP ANTIMIC 1/2X4 (GAUZE/BANDAGES/DRESSINGS) IMPLANT
COVER PERINEAL POST (MISCELLANEOUS) ×4 IMPLANT
COVER SURGICAL LIGHT HANDLE (MISCELLANEOUS) ×4 IMPLANT
DRAPE STERI IOBAN 125X83 (DRAPES) ×4 IMPLANT
DRILL 4.2 (DRILL) ×4
DRSG MEPILEX BORDER 4X4 (GAUZE/BANDAGES/DRESSINGS) ×4 IMPLANT
DRSG MEPILEX BORDER 4X8 (GAUZE/BANDAGES/DRESSINGS) IMPLANT
DURAPREP 26ML APPLICATOR (WOUND CARE) ×4 IMPLANT
ELECT CAUTERY BLADE 6.4 (BLADE) IMPLANT
ELECT REM PT RETURN 9FT ADLT (ELECTROSURGICAL) ×4
ELECTRODE REM PT RTRN 9FT ADLT (ELECTROSURGICAL) ×2 IMPLANT
EVACUATOR 1/8 PVC DRAIN (DRAIN) IMPLANT
FACESHIELD WRAPAROUND (MASK) ×8 IMPLANT
GAUZE XEROFORM 5X9 LF (GAUZE/BANDAGES/DRESSINGS) IMPLANT
GLOVE BIOGEL PI ORTHO PRO SZ8 (GLOVE) ×4
GLOVE ORTHO TXT STRL SZ7.5 (GLOVE) ×8 IMPLANT
GLOVE PI ORTHO PRO STRL SZ8 (GLOVE) ×4 IMPLANT
GLOVE SURG ORTHO 8.0 STRL STRW (GLOVE) ×8 IMPLANT
GOWN STRL REUS W/ TWL LRG LVL3 (GOWN DISPOSABLE) ×4 IMPLANT
GOWN STRL REUS W/TWL LRG LVL3 (GOWN DISPOSABLE) ×4
GUIDEWIRE 3.2X400 (WIRE) ×4 IMPLANT
KIT ROOM TURNOVER OR (KITS) ×4 IMPLANT
LINER BOOT UNIVERSAL DISP (MISCELLANEOUS) IMPLANT
MANIFOLD NEPTUNE II (INSTRUMENTS) ×4 IMPLANT
NAIL TI CANN 11MM 130D HIP (Nail) ×4 IMPLANT
NS IRRIG 1000ML POUR BTL (IV SOLUTION) ×4 IMPLANT
PACK GENERAL/GYN (CUSTOM PROCEDURE TRAY) ×4 IMPLANT
PAD ARMBOARD 7.5X6 YLW CONV (MISCELLANEOUS) ×12 IMPLANT
REAMER ROD DEEP FLUTE 2.5X950 (INSTRUMENTS) ×4 IMPLANT
SCREW LOCKING 5.0X48MM (Screw) ×4 IMPLANT
SCREW LOCKING 5.0X58 (Screw) ×4 IMPLANT
STRIP CLOSURE SKIN 1/2X4 (GAUZE/BANDAGES/DRESSINGS) ×3 IMPLANT
SUT VIC AB 0 CTB1 27 (SUTURE) ×4 IMPLANT
SUT VIC AB 3-0 SH 8-18 (SUTURE) ×4 IMPLANT
TOWEL OR 17X24 6PK STRL BLUE (TOWEL DISPOSABLE) ×4 IMPLANT
TOWEL OR 17X26 10 PK STRL BLUE (TOWEL DISPOSABLE) ×4 IMPLANT
WATER STERILE IRR 1000ML POUR (IV SOLUTION) ×4 IMPLANT

## 2016-02-10 NOTE — ED Notes (Signed)
Pants were brought back and belongings were found.

## 2016-02-10 NOTE — Op Note (Signed)
DATE OF SURGERY:  02/10/2016  TIME: 9:11 PM  PATIENT NAME:  Suzanne Barton  AGE: 65 y.o.  PRE-OPERATIVE DIAGNOSIS:  Right midshaft femur fracture  POST-OPERATIVE DIAGNOSIS:  SAME  PROCEDURE:  Placement of intramedullary nail, right femur fracture  SURGEON:  Nashya Garlington P  ASSISTANT:  Joya Gaskins, OPA-C, present and scrubbed throughout the case, critical for assistance with exposure, retraction, instrumentation, and closure.  OPERATIVE IMPLANTS: Synthes trochanteric femoral nail with interlocking helical blade into the femoral head with 2 distal interlocking bolts  UNIQUE ASPECTS OF THE CASE:  The bone quality was fairly sclerotic within the head, the fracture was actually fairly difficult to reduce on the lateral view. Additionally, the nail continued to try to find its way into the lateral femoral condyle, and it was somewhat challenging to get centered, but ultimately I did achieve it centered.  PREOPERATIVE INDICATIONS:  Suzanne Barton is a 65 y.o. year old who has previously had a femur fracture that underwent open reduction internal fixation back in the 1970s, and she was also treated in traction for a month apparently, and then more recently just the day twisted, felt her leg give way, and fell. She was brought into the ER and then admitted and optimized and then elected for surgical intervention.    The risks benefits and alternatives were discussed with the patient including but not limited to the risks of nonoperative treatment, versus surgical intervention including infection, bleeding, nerve injury, malunion, nonunion, hardware prominence, hardware failure, need for hardware removal, blood clots, cardiopulmonary complications, morbidity, mortality, among others, and they were willing to proceed.    OPERATIVE PROCEDURE:  The patient was brought to the operating room and placed in the supine position. Spinal was administered, with a foley. She was placed on the  fracture table.  Closed reduction was performed under C-arm guidance.  Time out was then performed after sterile prep and drape. She received preoperative antibiotics in the form of Ancef and tolerated it well. She also received nasal Betadine swab..  Incision was made proximal to the greater trochanter. A guidewire was placed in the appropriate position. Confirmation was made on AP and lateral views. The above-named nail was opened. I opened the proximal femur with a reamer. I then placed a guidewire across the fracture site, this was somewhat difficult, and I had to use the "finger" to get the wire across the fracture site. I then reamed sequentially, and placed the nail, the nail was directed somewhat more posteriorly, and I'm not sure if there was some pre-existing deformity to her femur, but we tried to reduce it as anatomically as possible although I could not achieve complete control over the fracture site without opening the fracture which I did not do.   Once the nail was completely seated, I placed a guidepin into the femoral head into the center center position. I measured the length, and then reamed the lateral cortex and up into the head. I then placed the helical blade. Bone quality was quite sclerotic in some places, and relatively poor in other places particularly in the mid shaft.  I then secured the proximal interlocking bolt completely, and then removed the instruments. I used perfect circles technique to place the distal 2 screws, locking the nail in rigid position.  I took final C-arm pictures AP and lateral the entire length of the leg. Anatomic reconstruction was achieved, and the wounds were irrigated copiously and closed with Vicryl followed by Steri-Strips and sterile gauze for the  skin. The patient was awakened and returned to PACU in stable and satisfactory condition. There no complications and the patient tolerated the procedure well.  She will be weightbearing as tolerated, and  will be on Lovenox  for a period of three weeks after discharge.   Marchia Bond, M.D.

## 2016-02-10 NOTE — ED Provider Notes (Signed)
CSN: [id number]     Arrival date & time 02/10/16  1513 History   First MD Initiated Contact with Patient 02/10/16 1516     Chief Complaint  Patient presents with  . Leg Injury     (Consider location/radiation/quality/duration/timing/severity/associated sxs/prior Treatment) Patient is a 65 y.o. female presenting with leg pain. The history is provided by the patient and medical records. No language interpreter was used.  Leg Pain Location:  Leg Injury: yes   Mechanism of injury comment:  Fell while walking downstairs, missed step and twisted right thigh laterally, fell to sitting position. Unable to walk afterward. Leg location:  R upper leg Pain details:    Quality:  Throbbing   Radiates to:  Does not radiate   Severity:  Severe   Onset quality:  Sudden   Timing:  Constant   Progression:  Unchanged Chronicity:  New Dislocation: no   Foreign body present:  No foreign bodies Relieved by:  Nothing Exacerbated by: movement. Ineffective treatments:  Rest Associated symptoms: decreased ROM and swelling   Associated symptoms: no fever, no muscle weakness and no neck pain   Risk factors comment:  Previous femur fx   Past Medical History  Diagnosis Date  . Hepatitis C 1992    in remission  . Allergy   . Anemia   . Blood transfusion   . Neuromuscular disorder     hiatal hernia  . Hyperlipidemia     no meds  . GERD (gastroesophageal reflux disease)    Past Surgical History  Procedure Laterality Date  . Ovarian cyst surgery    . Fracture surgery  1973    right femur  . Fracture surgery      left thumb  . Spine surgery  11-13-1999    vertebrae fusion  . Partial hysterectomy      had partial first, then later had all female organs removed  . Tubal ligation    . Appendectomy    . Cholecystectomy    . Knee arthoscopy      x3  . Cyst removed      x3- under right arm, x3- groin area- as a teenager  . Tonsillectomy and adenoidectomy    . Liver biopsy      x12   Family  History  Problem Relation Age of Onset  . Heart disease Father   . Heart failure Father   . Lung cancer Mother   . Cancer Mother     lung and brain  . COPD Sister   . Lung cancer Sister   . Colon cancer Neg Hx   . Esophageal cancer Neg Hx   . Stomach cancer Neg Hx   . Rectal cancer Neg Hx    Social History  Substance Use Topics  . Smoking status: Never Smoker   . Smokeless tobacco: Never Used     Comment: husband smoked x 13 yrs  . Alcohol Use: No   OB History    No data available     Review of Systems  Constitutional: Negative for fever and chills.  HENT: Negative for nosebleeds.   Eyes: Negative for visual disturbance.  Respiratory: Negative for cough and shortness of breath.   Cardiovascular: Negative for chest pain.  Gastrointestinal: Negative for nausea and vomiting.  Genitourinary: Negative for difficulty urinating.  Musculoskeletal: Positive for gait problem. Negative for neck pain.  Skin: Negative for wound.  Neurological: Negative for weakness, numbness and headaches.  Psychiatric/Behavioral: Negative for confusion.  Allergies  Tussionex pennkinetic er; Amoxicillin; Ibuprofen; and Sulfa antibiotics  Home Medications   Prior to Admission medications   Medication Sig Start Date End Date Taking? Authorizing Provider  beclomethasone (QVAR) 80 MCG/ACT inhaler Inhale 1 puff into the lungs 2 (two) times daily. 02/09/11   Brand Males, MD  diphenhydrAMINE (SOMINEX) 25 MG tablet Take 25 mg by mouth as needed.    Historical Provider, MD  Naproxen Sodium (ALEVE PO) Take by mouth as needed.    Historical Provider, MD  Pseudoeph-CPM-DM-APAP (ALKA-SELTZER PLUS COLD/COUGH) 30-2-10-250 MG CAPS Take 1 capsule by mouth as directed.      Historical Provider, MD   BP 168/65 mmHg  Pulse 72  Temp(Src) 97.8 F (36.6 C) (Oral)  Resp 16  SpO2 99% Physical Exam  Constitutional: She is oriented to person, place, and time. She appears well-developed and well-nourished.  No distress.  HENT:  Head: Normocephalic and atraumatic.  Eyes: EOM are normal. Pupils are equal, round, and reactive to light.  Neck: Normal range of motion. Neck supple.  No midline TTP  Cardiovascular: Normal rate, regular rhythm and intact distal pulses.   Pulmonary/Chest: Effort normal and breath sounds normal. No respiratory distress.  Abdominal: Soft. She exhibits no distension. There is no tenderness.  Musculoskeletal:  Right lower extremity is shortened about 2 cm compared with left. Right thigh with swelling, bruising and TTP. Right DP/PT pulses intact. RLE sensation intact. There is a well healed vertical surgical scar over right anterior thigh. Right knee without deformity, swelling, or TTP. ROM of right knee limited secondary to thigh pain. Right foot without deformity, swelling, or TTP. Able to perform full ROM of right ankle. No cervical, thoracic, or lumbar TTP. No stepoffs. LLE atraumatic. RUE, LUE atraumatic. Pelvis stable to anterior/lateral compression.   Neurological: She is alert and oriented to person, place, and time.  Skin: Skin is warm and dry. No rash noted.  Psychiatric: She has a normal mood and affect.  Nursing note and vitals reviewed.   ED Course  Procedures (including critical care time) Labs Review Labs Reviewed  BASIC METABOLIC PANEL - Abnormal; Notable for the following:    Glucose, Bld 130 (*)    Creatinine, Ser 1.03 (*)    GFR calc non Af Amer 57 (*)    All other components within normal limits  CBC WITH DIFFERENTIAL/PLATELET  CBC  BASIC METABOLIC PANEL    Imaging Review  Dg Pelvis 1-2 Views  02/10/2016  CLINICAL DATA:  Fall today.  Pelvic pain. EXAM: PELVIS - 1-2 VIEW COMPARISON:  None. FINDINGS: There is no evidence of pelvic fracture or diastasis. No pelvic bone lesions are seen. Mild to moderate bilateral hip osteoarthritis noted as well as lower lumbar spine degenerative changes. IMPRESSION: No evidence of pelvic fracture. Electronically  Signed   By: Earle Gell M.D.   On: 02/10/2016 16:29   Dg Femur, Min 2 Views Right  02/10/2016  CLINICAL DATA:  Fall today. Right femur pain and fracture deformity. Initial encounter. EXAM: RIGHT FEMUR 2 VIEWS COMPARISON:  None. FINDINGS: A mildly comminuted oblique fracture of the mid femoral diaphysis is seen with medial and posterior displacement and angulation of the distal fracture fragment. Mild right hip osteoarthritis also demonstrated. IMPRESSION: Mid femoral shaft fracture, as described above. Electronically Signed   By: Earle Gell M.D.   On: 02/10/2016 16:28   I have personally reviewed and evaluated these images and lab results as part of my medical decision-making.   EKG Interpretation None  MDM   Final diagnoses:  Fall  Hip fracture, right [facility])  Surgery, elective    41F with PMH remote femur fx presents with right midthigh pain after a fall down one stairs. Received Fentanyl 256mcg pta with improvement in discomfort. On exam, patient with swelling and TTP in right mid thigh area. Patient denies hitting head, she is not on blood thinners. No other new injuries identified on exam. Likely femur fracture. Will obtain XR right hip, pelvis, and femur. IV dilaudid given. Patient kept npo. Reports last po was a sip of water on EMS arrival.   Imaging shows mid femoral shaft fracture with displacement and angulation of the distal fracture fragment. Imaging of pelvis and hip do not show additional injuries.  Orthopedic surgery consulted, as patient is few comorbidities, plan to admit to orthopedics (Dr. Mardelle Matte attending) for operative management. Place was patient was placed in Buck's traction per Dr. Luanna Cole recommendation. Patient and husband were updated at the bedside and patient was admitted without further acute issues during my care.  Patient seen and discussed with Dr. Dayna Barker, ED attending.     Gibson Ramp, MD 02/11/16 [id number]  Merrily Pew, MD 02/11/16 [id number]

## 2016-02-10 NOTE — Transfer of Care (Signed)
Immediate Anesthesia Transfer of Care Note  Patient: Suzanne Barton  Procedure(s) Performed: Procedure(s): INTRAMEDULLARY (IM) NAIL FEMORAL (Right)  Patient Location: PACU  Anesthesia Type:MAC and Spinal  Level of Consciousness: awake  Airway & Oxygen Therapy: Patient Spontanous Breathing and Patient connected to nasal cannula oxygen  Post-op Assessment: Report given to RN and Post -op Vital signs reviewed and stable  Post vital signs: Reviewed and stable  Last Vitals:  Filed Vitals:   02/10/16 1800 02/10/16 2130  BP: 123/56 93/50  Pulse: 69 83  Temp:  36.4 C  Resp: 17 16    Last Pain:  Filed Vitals:   02/10/16 2133  PainSc: 7          Complications: No apparent anesthesia complications

## 2016-02-10 NOTE — Discharge Instructions (Signed)

## 2016-02-10 NOTE — Anesthesia Preprocedure Evaluation (Signed)
Anesthesia Evaluation  Patient identified by MRN, date of birth, ID band Patient awake    Reviewed: Allergy & Precautions, NPO status , Patient's Chart, lab work & pertinent test results  History of Anesthesia Complications Negative for: history of anesthetic complications  Airway Mallampati: II  TM Distance: >3 FB Neck ROM: Full    Dental  (+) Missing   Pulmonary neg pulmonary ROS,    breath sounds clear to auscultation- rhonchi       Cardiovascular negative cardio ROS   Rhythm:Regular     Neuro/Psych neg Seizures  Neuromuscular disease negative psych ROS   GI/Hepatic GERD  Medicated and Controlled,(+) Hepatitis -, C  Endo/Other  negative endocrine ROS  Renal/GU negative Renal ROS     Musculoskeletal   Abdominal   Peds  Hematology negative hematology ROS (+)   Anesthesia Other Findings   Reproductive/Obstetrics                             Anesthesia Physical Anesthesia Plan  ASA: III  Anesthesia Plan: MAC and Spinal   Post-op Pain Management:    Induction: Intravenous  Airway Management Planned: Natural Airway, Nasal Cannula and Simple Face Mask  Additional Equipment: None  Intra-op Plan:   Post-operative Plan:   Informed Consent: I have reviewed the patients History and Physical, chart, labs and discussed the procedure including the risks, benefits and alternatives for the proposed anesthesia with the patient or authorized representative who has indicated his/her understanding and acceptance.   Dental advisory given  Plan Discussed with: CRNA and Surgeon  Anesthesia Plan Comments:         Anesthesia Quick Evaluation

## 2016-02-10 NOTE — ED Notes (Signed)
Pt to ED via [facility] after falling on her deck.  Pt has obvious deformity to right femur.  Pt was given Fentanyl 219mcg PTA.  Pt denies any other injuries or complaints.  No LOC

## 2016-02-10 NOTE — Anesthesia Procedure Notes (Signed)
Spinal Patient location during procedure: OR Staffing Anesthesiologist: Karely Hurtado Preanesthetic Checklist Completed: patient identified, surgical consent, pre-op evaluation, timeout performed, IV checked, risks and benefits discussed and monitors and equipment checked Spinal Block Patient position: right lateral decubitus Prep: site prepped and draped and DuraPrep Patient monitoring: heart rate, cardiac monitor, continuous pulse ox and blood pressure Approach: midline Location: L3-4 Injection technique: single-shot Needle Needle type: Pencan  Needle gauge: 24 G Needle length: 10 cm Assessment Sensory level: T6   

## 2016-02-10 NOTE — ED Notes (Signed)
Pt to xray at this time.
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
## 2016-02-10 NOTE — ED Provider Notes (Signed)
I saw and evaluated the patient, reviewed the resident's note and I agree with the findings and plan.  Patient stepped wrong and fell down a small step injuring her right upper leg. Not able to walk afterwards. A 200 g of fentanyl in route with EMS which helped her pain. On my exam patient has tenderness in her right mid thigh area, with about an inch of shortening. Did not attempt range of motion exercises. Rest of muscular skeletal exam did not reveal any new injury she did have some bruising in her left tib-fib area but this is old. Denies hit her head or C-spine and had no evidence of trauma to those areas either. We'll plan to concentrate on her right femur right hip to evaluate for fracture.    EKG Interpretation None        Merrily Pew, MD 02/11/16 [phone]
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
## 2016-02-11 ENCOUNTER — Encounter (HOSPITAL_COMMUNITY): Payer: Self-pay | Admitting: Orthopedic Surgery

## 2016-02-11 LAB — CBC
HEMATOCRIT: 31.1 % — AB (ref 36.0–46.0)
Hemoglobin: 10 g/dL — ABNORMAL LOW (ref 12.0–15.0)
MCH: 28.3 pg (ref 26.0–34.0)
MCHC: 32.2 g/dL (ref 30.0–36.0)
MCV: 88.1 fL (ref 78.0–100.0)
Platelets: 179 10*3/uL (ref 150–400)
RBC: 3.53 MIL/uL — ABNORMAL LOW (ref 3.87–5.11)
RDW: 13.1 % (ref 11.5–15.5)
WBC: 8.9 10*3/uL (ref 4.0–10.5)

## 2016-02-11 LAB — BASIC METABOLIC PANEL
ANION GAP: 5 (ref 5–15)
BUN: 7 mg/dL (ref 6–20)
CALCIUM: 8.8 mg/dL — AB (ref 8.9–10.3)
CO2: 28 mmol/L (ref 22–32)
CREATININE: 0.96 mg/dL (ref 0.44–1.00)
Chloride: 103 mmol/L (ref 101–111)
GFR calc Af Amer: >60 mL/min (ref >60)
GFR calc non Af Amer: >60 mL/min (ref >60)
GLUCOSE: 131 mg/dL — AB (ref 65–99)
Potassium: 4.3 mmol/L (ref 3.5–5.1)
Sodium: 136 mmol/L (ref 135–145)

## 2016-02-11 NOTE — Progress Notes (Signed)
Patient ID: Suzanne Barton, female   DOB: Jan 25, 1951, 65 y.o.   MRN: [id number]     Subjective:  Patient reports pain as mild.  Patient in bed and in no acute distress.  Patient asking about going home and not to rehab  Objective:   VITALS:   Filed Vitals:   02/10/16 2200 02/10/16 2208 02/11/16 0155 02/11/16 0411  BP: 108/67 99/66 101/46 111/76  Pulse: 78 79 83 89  Temp:  97.6 F (36.4 C) 98.6 F (37 C) 98.4 F (36.9 C)  TempSrc:   Oral Oral  Resp: 14 20 18 18   SpO2: 100% 100% 100% 99%    ABD soft Sensation intact distally Dorsiflexion/Plantar flexion intact Incision: dressing C/D/I and no drainage   Lab Results  Component Value Date   WBC 8.9 02/11/2016   HGB 10.0* 02/11/2016   HCT 31.1* 02/11/2016   MCV 88.1 02/11/2016   PLT 179 02/11/2016   BMET    Component Value Date/Time   NA 136 02/11/2016 0243   K 4.3 02/11/2016 0243   CL 103 02/11/2016 0243   CO2 28 02/11/2016 0243   GLUCOSE 131* 02/11/2016 0243   BUN 7 02/11/2016 0243   CREATININE 0.96 02/11/2016 0243   CALCIUM 8.8* 02/11/2016 0243   GFRNONAA >60 02/11/2016 0243   GFRAA >60 02/11/2016 0243     Assessment/Plan: 1 Day Post-Op   Principal Problem:   Femur fracture, right (HCC)   [facility] diet Up with therapy Plan for discharge tomorrow Dry dressing PRN    Remonia Richter 02/11/2016, 9:47 AM  Discussed and agreed with above, likely discharge home. Plan for tomorrow.  Marchia Bond, MD Cell [phone]

## 2016-02-11 NOTE — Evaluation (Signed)
Occupational Therapy Evaluation Patient Details Name: Suzanne Barton MRN: [id number] DOB: 1951-01-12 Today's Date: 02/11/2016    History of Present Illness Suzanne Barton is a 65 y.o. female s/p fall with resultant right femur fx and now s/p IM nail   Clinical Impression   This 65 yo female admitted and underwent above presents to acute OT with deficits below (see OT problem list) thus affecting her PLOF to independent with basic and IADLs. She will benefit from one more session of acute OT to address tub transfers.    Follow Up Recommendations  No OT follow up;Supervision/Assistance - 24 hour    Equipment Recommendations  None recommended by OT       Precautions / Restrictions Precautions Precautions: Fall Restrictions Weight Bearing Restrictions: Yes RLE Weight Bearing: Weight bearing as tolerated      Mobility Bed Mobility Overal bed mobility: Needs Assistance Bed Mobility: Supine to Sit     Supine to sit: HOB elevated;Min guard (use of rail)        Transfers Overall transfer level: Needs assistance Equipment used: Rolling walker (2 wheeled) Transfers: Sit to/from Stand Sit to Stand: Min guard              Balance Overall balance assessment: Needs assistance Sitting-balance support: No upper extremity supported;Feet supported Sitting balance-Leahy Scale: Good     Standing balance support: Bilateral upper extremity supported;During functional activity Standing balance-Leahy Scale: Poor Standing balance comment: reliant on RW                            ADL Overall ADL's : Needs assistance/impaired Eating/Feeding: Independent;Sitting   Grooming: Set up;Sitting   Upper Body Bathing: Set up;Sitting   Lower Body Bathing: Moderate assistance (min guard A sit<>stand)   Upper Body Dressing : Set up;Sitting   Lower Body Dressing: Moderate assistance (min guard A sit<>stand)     Toilet Transfer Details (indicate cue type and  reason): Minimal A to<>from BSC over toilet in bathroom with min guard A sit<>stand Toileting- Clothing Manipulation and Hygiene: Min guard;Sit to/from stand          Reports husband can A her prn for LB ADLs and she is aware she needs to put RLE in underwear and pants first when she is getting dressed.               Pertinent Vitals/Pain Pain Assessment: 0-10 Pain Score: 6  Pain Location: RLE Pain Descriptors / Indicators: Aching;Sore Pain Intervention(s): Monitored during session;Repositioned;Patient requesting pain meds-RN notified     Hand Dominance Right   Extremity/Trunk Assessment Upper Extremity Assessment Upper Extremity Assessment: Overall WFL for tasks assessed   Lower Extremity Assessment Lower Extremity Assessment: Defer to PT evaluation       Communication Communication Communication: No difficulties   Cognition Arousal/Alertness: Awake/alert Behavior During Therapy: WFL for tasks assessed/performed Overall Cognitive Status: Within Functional Limits for tasks assessed                                Home Living Family/patient expects to be discharged to:: Private residence Living Arrangements: Spouse/significant other Available Help at Discharge: Family;Available 24 hours/day Type of Home: House Home Access: Stairs to enter [employer] of Steps: one up onto porch, one into house Entrance Stairs-Rails: None Home Layout: One level     Bathroom Shower/Tub: Tub/shower unit;Curtain Shower/tub characteristics: Architectural technologist: Handicapped  height     Home Equipment: Shower seat;Hand held shower head;Grab bars - tub/shower;Grab bars - toilet;Walker - 2 wheels          Prior Functioning/Environment Level of Independence: Independent             OT Diagnosis: Generalized weakness;Acute pain   OT Problem List: Decreased strength;Decreased range of motion;Impaired balance (sitting and/or standing);Pain;Decreased  knowledge of use of DME or AE   OT Treatment/Interventions: Self-care/ADL training;Patient/family education;Balance training;DME and/or AE instruction    OT Goals(Current goals can be found in the care plan section) Acute Rehab OT Goals Patient Stated Goal: to go home OT Goal Formulation: With patient Time For Goal Achievement: 02/18/16 Potential to Achieve Goals: Good  OT Frequency: Min 2X/week           Co-evaluation PT/OT/SLP Co-Evaluation/Treatment: Yes Reason for Co-Treatment: For patient/therapist safety   OT goals addressed during session: ADL's and self-care;Strengthening/ROM      End of Session Equipment Utilized During Treatment: Gait belt;Rolling walker Nurse Communication: Mobility status  Activity Tolerance: Patient tolerated treatment well Patient left: in chair;with chair alarm set   Time: [id number] OT Time Calculation (min): 22 min Charges:  OT General Charges $OT Visit: 1 Procedure OT Evaluation $OT Eval Moderate Complexity: 1 Procedure  Almon Register [id number] 02/11/2016, 11:21 AM

## 2016-02-11 NOTE — Progress Notes (Signed)
Orthopedic Tech Progress Note Patient Details:  Suzanne Barton [place] Nov 20, 1950 [id number]  Patient ID: Suzanne Barton, female   DOB: 20-Nov-1950, 65 y.o.   MRN: [id number] [employer].  Suzanne Barton 02/11/2016, 9:02 AM

## 2016-02-11 NOTE — Evaluation (Signed)
Physical Therapy Evaluation Patient Details Name: Suzanne Barton MRN: [id number] DOB: 1951/04/03 Today's Date: 02/11/2016   History of Present Illness  Suzanne Barton is a 65 y.o. female s/p fall with resultant right femur fx and now s/p IM nail  Clinical Impression  Pt very motivated and able to recall technique from helping her mother-in-law who recently suffered a hip fx.  Anticipate pt will make great progress to return to home with family support.  Will continue to follow.      Follow Up Recommendations No PT follow up;Supervision - Intermittent    Equipment Recommendations  None recommended by PT    Recommendations for Other Services       Precautions / Restrictions Precautions Precautions: Fall Restrictions Weight Bearing Restrictions: Yes RLE Weight Bearing: Weight bearing as tolerated      Mobility  Bed Mobility Overal bed mobility: Needs Assistance Bed Mobility: Supine to Sit     Supine to sit: HOB elevated;Min guard (use of rail)     General bed mobility comments: pt demonstrates good use of L LE to support R LE with coming to sitting.  No cues needed.    Transfers Overall transfer level: Needs assistance Equipment used: Rolling walker (2 wheeled) Transfers: Sit to/from Stand Sit to Stand: Min guard         General transfer comment: cues for UE use only.  Ambulation/Gait Ambulation/Gait assistance: Min guard Ambulation Distance (Feet): 25 Feet (and 20) Assistive device: Rolling walker (2 wheeled) Gait Pattern/deviations: Step-to pattern;Decreased step length - left;Decreased stance time - right     General Gait Details: pt needs cues for gait sequencing and encouragement.    Stairs            Wheelchair Mobility    Modified Rankin (Stroke Patients Only)       Balance Overall balance assessment: Needs assistance Sitting-balance support: No upper extremity supported;Feet supported Sitting balance-Leahy Scale: Good      Standing balance support: Bilateral upper extremity supported;During functional activity Standing balance-Leahy Scale: Poor Standing balance comment: reliant on RW                             Pertinent Vitals/Pain Pain Assessment: 0-10 Pain Score: 6  Pain Location: R LE Pain Descriptors / Indicators: Aching;Sore Pain Intervention(s): Monitored during session;Premedicated before session;Repositioned    Home Living Family/patient expects to be discharged to:: Private residence Living Arrangements: Spouse/significant other Available Help at Discharge: Family;Available 24 hours/day Type of Home: House Home Access: Stairs to enter Entrance Stairs-Rails: None Entrance Stairs-Number of Steps: one up onto porch, one into house Home Layout: One level Home Equipment: Shower seat;Hand held shower head;Grab bars - tub/shower;Grab bars - toilet;Walker - 2 wheels      Prior Function Level of Independence: Independent               Hand Dominance   Dominant Hand: Right    Extremity/Trunk Assessment   Upper Extremity Assessment: Defer to OT evaluation           Lower Extremity Assessment: RLE deficits/detail RLE Deficits / Details: AROM WFL, Overall strength limited by pain.    Cervical / Trunk Assessment: Normal  Communication   Communication: No difficulties  Cognition Arousal/Alertness: Awake/alert Behavior During Therapy: WFL for tasks assessed/performed Overall Cognitive Status: Within Functional Limits for tasks assessed  General Comments      Exercises        Assessment/Plan    PT Assessment Patient needs continued PT services  PT Diagnosis Difficulty walking;Acute pain   PT Problem List Decreased strength;Decreased activity tolerance;Decreased balance;Decreased mobility;Decreased knowledge of use of DME;Pain  PT Treatment Interventions DME instruction;Gait training;Stair training;Functional mobility  training;Therapeutic activities;Therapeutic exercise;Balance training;Patient/family education   PT Goals (Current goals can be found in the Care Plan section) Acute Rehab PT Goals Patient Stated Goal: to go home PT Goal Formulation: With patient Time For Goal Achievement: 02/18/16 Potential to Achieve Goals: Good    Frequency Min 5X/week   Barriers to discharge        Co-evaluation PT/OT/SLP Co-Evaluation/Treatment: Yes Reason for Co-Treatment: For patient/therapist safety PT goals addressed during session: Mobility/safety with mobility;Balance;Proper use of DME;Strengthening/ROM OT goals addressed during session: ADL's and self-care;Strengthening/ROM       End of Session Equipment Utilized During Treatment: Gait belt Activity Tolerance: Patient tolerated treatment well Patient left: in chair;with call bell/phone within reach;with chair alarm set Nurse Communication: Mobility status         Time: [id number] PT Time Calculation (min) (ACUTE ONLY): 19 min   Charges:   PT Evaluation $PT Eval Moderate Complexity: 1 Procedure     PT G CodesCatarina Hartshorn, [place] 02/11/2016, 12:04 PM

## 2016-02-11 NOTE — Care Management Note (Signed)
Case Management Note  Patient Details  Name: Suzanne Barton MRN: [id number] Date of Birth: [birth year]  Subjective/Objective:             S/p right hip IM nail       Action/Plan: Spoke with patient and her husband about discharge plan. Gave choice for home health, they selected [facility]. Contacted Susan at [facility] and set up [place]. Patient stated that she has a rolling walker and 3N1 at home. Patient's husband will be assisting her after discharge.  Expected Discharge Date:                  Expected Discharge Plan:  [place]  In-House Referral:  Clinical Social Work  Discharge planning Services  CM Consult  Post Acute Care Choice:  Home Health Choice offered to:  Patient  DME Arranged:  N/A DME Agency:     HH Arranged:  PT [place] Agency:  [facility]  Status of Service:  Completed, signed off  Medicare Important Message Given:    Date Medicare IM Given:    Medicare IM give by:    Date Additional Medicare IM Given:    Additional Medicare Important Message give by:     If discussed at [place] of Stay Meetings, dates discussed:    Additional Comments:  Nila Nephew, RN 02/11/2016, 4:37 PM

## 2016-02-12 LAB — CBC
HEMATOCRIT: 26.6 % — AB (ref 36.0–46.0)
Hemoglobin: 8.5 g/dL — ABNORMAL LOW (ref 12.0–15.0)
MCH: 28.5 pg (ref 26.0–34.0)
MCHC: 32 g/dL (ref 30.0–36.0)
MCV: 89.3 fL (ref 78.0–100.0)
Platelets: 144 10*3/uL — ABNORMAL LOW (ref 150–400)
RBC: 2.98 MIL/uL — AB (ref 3.87–5.11)
RDW: 13.5 % (ref 11.5–15.5)
WBC: 5.4 10*3/uL (ref 4.0–10.5)

## 2016-02-12 LAB — BASIC METABOLIC PANEL
Anion gap: 7 (ref 5–15)
BUN: <5 mg/dL — ABNORMAL LOW (ref 6–20)
CO2: 27 mmol/L (ref 22–32)
CREATININE: 0.92 mg/dL (ref 0.44–1.00)
Calcium: 8.5 mg/dL — ABNORMAL LOW (ref 8.9–10.3)
Chloride: 104 mmol/L (ref 101–111)
GFR calc Af Amer: >60 mL/min (ref >60)
GFR calc non Af Amer: >60 mL/min (ref >60)
Glucose, Bld: 86 mg/dL (ref 65–99)
Potassium: 4.1 mmol/L (ref 3.5–5.1)
Sodium: 138 mmol/L (ref 135–145)

## 2016-02-12 NOTE — Progress Notes (Signed)
Physical Therapy Treatment Patient Details Name: Suzanne Barton MRN: [id number] DOB: 12-06-1950 Today's Date: 02/12/2016    History of Present Illness Suzanne Barton is a 65 y.o. female s/p fall with resultant right femur fx and now s/p IM nail    PT Comments    Pt is making steady progress toward goals. Acute PT to continue.  Follow Up Recommendations  No PT follow up;Supervision - Intermittent     Equipment Recommendations  Rolling walker with 5" wheels (pt does not have one at home)    Precautions / Restrictions Precautions Precautions: Fall Restrictions Weight Bearing Restrictions: Yes RLE Weight Bearing: Weight bearing as tolerated    Mobility  Bed Mobility Overal bed mobility: Needs Assistance Bed Mobility: Supine to Sit;Sit to Supine     Supine to sit: Min assist Sit to supine: Min assist   General bed mobility comments: bed flat and no rails used. cues needed on technique and sequencing to exit on right side of bed. pt shown to use belt (sheet) to move right leg herself. minimal assist for right leg support once it was off edge of bed.  Transfers Overall transfer level: Modified independent Equipment used: Rolling walker (2 wheeled) Transfers: Sit to/from Stand Sit to Stand: Min guard         General transfer comment: cues for hand placement and weight shifting with standing up  Ambulation/Gait Ambulation/Gait assistance: Min guard;Supervision Ambulation Distance (Feet): 40 Feet Assistive device: Rolling walker (2 wheeled) Gait Pattern/deviations: Step-to pattern;Antalgic;Decreased step length - left;Decreased stance time - right Gait velocity: decreased Gait velocity interpretation: Below normal speed for age/gender General Gait Details: cues on posture, walker position with gait and to correct listed gait deviations.   Stairs Stairs: Yes Stairs assistance: Min guard Stair Management: Step to pattern;Forwards;With walker Number of Stairs:  1 General stair comments: demo'd to pt prior to pt performance. minimal reminder cues needed during pt performance with min guard assist for balance,      Balance Overall balance assessment: Needs assistance Sitting-balance support: No upper extremity supported;Feet supported Sitting balance-Leahy Scale: Good     Standing balance support: Bilateral upper extremity supported;During functional activity Standing balance-Leahy Scale: Poor Standing balance comment: reliant on rw                    Cognition Arousal/Alertness: Awake/alert Behavior During Therapy: WFL for tasks assessed/performed Overall Cognitive Status: Within Functional Limits for tasks assessed                      Exercises Total Joint Exercises Ankle Circles/Pumps: AROM;Strengthening;Both;10 reps;Supine Quad Sets: AROM;Strengthening;Right;10 reps;Supine Heel Slides: AAROM;Strengthening;Right;10 reps;Supine Straight Leg Raises: AROM;Strengthening;Right;10 reps;Supine     Pertinent Vitals/Pain Pain Assessment: 0-10 Pain Score: 6  Pain Location: right knee Pain Descriptors / Indicators: Aching;Sore;Tender Pain Intervention(s): Limited activity within patient's tolerance;Monitored during session;Premedicated before session;Repositioned;Ice applied     PT Goals (current goals can now be found in the care plan section) Acute Rehab PT Goals Patient Stated Goal: to go home PT Goal Formulation: With patient Time For Goal Achievement: 02/18/16 Potential to Achieve Goals: Good Progress towards PT goals: Progressing toward goals    Frequency  Min 5X/week    PT Plan Current plan remains appropriate;Equipment recommendations need to be updated    End of Session Equipment Utilized During Treatment: Gait belt Activity Tolerance: Patient tolerated treatment well;Patient limited by pain Patient left: in bed;with call bell/phone within reach;with family/visitor present  Time: 1431-1456 PT  Time Calculation (min) (ACUTE ONLY): 25 min  Charges:  $Gait Training: 8-22 mins $Therapeutic Exercise: 8-22 mins           Willow Ora 02/12/2016, 3:02 PM  Willow Ora, PTA, CLT Acute Rehab Services [phone] 02/12/2016, 3:03 PM

## 2016-02-12 NOTE — Progress Notes (Signed)
Patient ID: Suzanne Barton, female   DOB: [birth year], 65 y.o.   MRN: [id number]     Subjective:  Patient reports pain as mild to moderate.  Patient having more pain today and would like to work with PT more before DC  Objective:   VITALS:   Filed Vitals:   02/12/16 1138 02/12/16 2022 02/12/16 2038 02/13/16 0410  BP: 101/41  122/50 94/33  Pulse: 74  82 94  Temp: 98.8 F (37.1 C)  98.7 F (37.1 C) 99.2 F (37.3 C)  TempSrc: Oral  Oral Oral  Resp: 16  18 16   SpO2: 96% 96% 100% 95%    ABD soft Sensation intact distally Dorsiflexion/Plantar flexion intact Incision: dressing C/D/I and no drainage Moderate swelling good foot and ankle motion  Lab Results  Component Value Date   WBC 5.5 02/13/2016   HGB 8.6* 02/13/2016   HCT 26.0* 02/13/2016   MCV 88.4 02/13/2016   PLT 155 02/13/2016   BMET    Component Value Date/Time   NA 138 02/12/2016 0624   K 4.1 02/12/2016 0624   CL 104 02/12/2016 0624   CO2 27 02/12/2016 0624   GLUCOSE 86 02/12/2016 0624   BUN <5* 02/12/2016 0624   CREATININE 0.92 02/12/2016 0624   CALCIUM 8.5* 02/12/2016 0624   GFRNONAA >60 02/12/2016 0624   GFRAA >60 02/12/2016 0624     Assessment/Plan: 3 Days Post-Op   Principal Problem:   Femur fracture, right (HCC)   Advance diet Up with therapy Plan for discharge tomorrow WBAT Dry dressing PRN ABLA: monitor.    Vail Basista P 02/13/2016, 9:37 AM  Agree with above.   Marchia Bond, MD Cell [phone]

## 2016-02-12 NOTE — Progress Notes (Addendum)
Occupational Therapy Treatment Patient Details Name: Suzanne Barton MRN: [id number] DOB: Jan 22, 1951 Today's Date: 02/12/2016    History of present illness Suzanne Barton is a 65 y.o. female s/p fall with resultant right femur fx and now s/p IM nail   OT comments  Pt progressing towards acute OT goals. Focus of session was tub shower transfer. Pt limited by pain. Unable to fully complete simulated tub transfer this session. Bed mobility was a struggle for pt this session but ultimately completed with min A, HOB partially elevated, heavy use of trapeze bar and rails. Spouse present at end of session. D/c plan remains appropriate.   Follow Up Recommendations  No OT follow up;Supervision/Assistance - 24 hour    Equipment Recommendations  None recommended by OT    Recommendations for Other Services      Precautions / Restrictions Precautions Precautions: Fall Restrictions Weight Bearing Restrictions: Yes RLE Weight Bearing: Weight bearing as tolerated       Mobility Bed Mobility Overal bed mobility: Needs Assistance Bed Mobility: Supine to Sit;Sit to Supine     Supine to sit: Min assist Sit to supine: Min assist   General bed mobility comments: HOB partially raised. Assist to bring hips fully to EOB and to help power up trunk to EOB. Extra effort and heavy use of trapeze bar and rails. Assist to advance RLE onto bed to return to supine.   Transfers Overall transfer level: Needs assistance Equipment used: Rolling walker (2 wheeled) Transfers: Sit to/from Stand Sit to Stand: Min guard;Min assist         General transfer comment: cues for technique. min guard from EOB. Min A to stabilize rw and powerup to stand from chair (lower surface) with pt using BUE on rw during standing.    Balance Overall balance assessment: Needs assistance Sitting-balance support: No upper extremity supported;Feet supported Sitting balance-Leahy Scale: Good     Standing balance  support: Bilateral upper extremity supported;During functional activity Standing balance-Leahy Scale: Poor Standing balance comment: reliant on rw                   ADL Overall ADL's : Needs assistance/impaired                                 Tub/ Shower Transfer: Moderate assistance;Ambulation;Tub transfer;Shower Technical sales engineer Details (indicate cue type and reason): Educated on tub transfer to shower seat with spouse present. Provided visual demonstrationi. Pt attempted part of transfer (sit<>stand from chair) but declined full transfer due to pain. Simualted tub shower setup in room.  Functional mobility during ADLs: Min guard;Rolling walker General ADL Comments: Tub transfer as detailed above. Functional mobility in-room. Pt limited by pain. Spouse present for session.       Vision                     Perception     Praxis      Cognition   Behavior During Therapy: [employer] for tasks assessed/performed Overall Cognitive Status: Within Functional Limits for tasks assessed                       Extremity/Trunk Assessment               Exercises     Shoulder Instructions       General Comments      Pertinent Vitals/ Pain  Pain Assessment: 0-10 Pain Score: 8  Pain Location: RLE Pain Descriptors / Indicators: Aching;Tightness;Sore Pain Intervention(s): Limited activity within patient's tolerance;Monitored during session;Repositioned;Utilized relaxation techniques  Home Living                                          Prior Functioning/Environment              Frequency Min 2X/week     Progress Toward Goals  OT Goals(current goals can now be found in the care plan section)  Progress towards OT goals: Progressing toward goals  Acute Rehab OT Goals Patient Stated Goal: to go home OT Goal Formulation: With patient Time For Goal Achievement: 02/18/16 Potential to Achieve  Goals: Good ADL Goals Pt Will Perform Tub/Shower Transfer: Tub transfer;ambulating;rolling walker;shower seat  Plan Discharge plan remains appropriate    Co-evaluation                 End of Session Equipment Utilized During Treatment: Gait belt;Rolling walker   Activity Tolerance Patient limited by pain   Patient Left in bed;with call bell/phone within reach;with family/visitor present;with SCD's reapplied   Nurse Communication          Time: [id number] OT Time Calculation (min): 29 min  Charges: OT General Charges $OT Visit: 1 Procedure OT Treatments $Self Care/Home Management : 23-37 mins  Hortencia Pilar 02/12/2016, 1:14 PM

## 2016-02-13 LAB — CBC
HEMATOCRIT: 26 % — AB (ref 36.0–46.0)
Hemoglobin: 8.6 g/dL — ABNORMAL LOW (ref 12.0–15.0)
MCH: 29.3 pg (ref 26.0–34.0)
MCHC: 33.1 g/dL (ref 30.0–36.0)
MCV: 88.4 fL (ref 78.0–100.0)
Platelets: 155 10*3/uL (ref 150–400)
RBC: 2.94 MIL/uL — ABNORMAL LOW (ref 3.87–5.11)
RDW: 13.4 % (ref 11.5–15.5)
WBC: 5.5 10*3/uL (ref 4.0–10.5)

## 2016-02-13 NOTE — Progress Notes (Signed)
Cm received call from RN for DME.  CM called [facility] DME rep, Merry Proud to please deliver the rolling walker to room so pt can discharge.  CM notes HHPT already setup with [facility] from previous Cm.  No other CM needs were communicated.

## 2016-02-13 NOTE — Progress Notes (Signed)
Patient ID: Suzanne Barton, female   DOB: 1951/08/10, 65 y.o.   MRN: [id number]     Subjective:  Patient reports pain as mild.  Patient denies any CP or SOB.  In no acute distress  Objective:   VITALS:   Filed Vitals:   02/12/16 1138 02/12/16 2022 02/12/16 2038 02/13/16 0410  BP: 101/41  122/50 94/33  Pulse: 74  82 94  Temp: 98.8 F (37.1 C)  98.7 F (37.1 C) 99.2 F (37.3 C)  TempSrc: Oral  Oral Oral  Resp: 16  18 16   SpO2: 96% 96% 100% 95%    ABD soft Sensation intact distally Dorsiflexion/Plantar flexion intact Incision: dressing C/D/I and no drainage Good foot and ankle motion   Lab Results  Component Value Date   WBC 5.5 02/13/2016   HGB 8.6* 02/13/2016   HCT 26.0* 02/13/2016   MCV 88.4 02/13/2016   PLT 155 02/13/2016   BMET    Component Value Date/Time   NA 138 02/12/2016 0624   K 4.1 02/12/2016 0624   CL 104 02/12/2016 0624   CO2 27 02/12/2016 0624   GLUCOSE 86 02/12/2016 0624   BUN <5* 02/12/2016 0624   CREATININE 0.92 02/12/2016 0624   CALCIUM 8.5* 02/12/2016 0624   GFRNONAA >60 02/12/2016 0624   GFRAA >60 02/12/2016 0624     Assessment/Plan: 3 Days Post-Op   Principal Problem:   Femur fracture, right (HCC)   Advance diet Up with therapy Discharge home with home health if patient does well with PT WBAT Dry dressing PRN ABLA monitor and stable.  Rande Brunt, BRANDON 02/13/2016, 9:05 AM  Agree with above.   Marchia Bond, MD Cell [phone]

## 2016-02-13 NOTE — Discharge Summary (Signed)
Physician Discharge Summary  Patient ID: NATALE MELGAREJO MRN: [id number] DOB/AGE: 1951-05-14 65 y.o.  Admit date: 02/10/2016 Discharge date: 02/13/2016  Admission Diagnoses:  Femur fracture, right [facility])  Discharge Diagnoses:  Principal Problem:   Femur fracture, right ([place]) ABLA observed  Past Medical History  Diagnosis Date  . Hepatitis C 1992    in remission  . Allergy   . Anemia   . Blood transfusion   . Neuromuscular disorder ([place])     hiatal hernia  . Hyperlipidemia     no meds  . GERD (gastroesophageal reflux disease)   . Femur fracture, right ([place]) 02/10/2016    Surgeries: Procedure(s): INTRAMEDULLARY (IM) NAIL FEMORAL on 02/10/2016   Consultants (if any):    Discharged Condition: Improved  Hospital Course: Insiyah Hanover Rundle is an 65 y.o. female who was admitted 02/10/2016 with a diagnosis of Femur fracture, right ([place]) and went to the operating room on 02/10/2016 and underwent the above named procedures.    She was given perioperative antibiotics:  Anti-infectives    Start     Dose/Rate Route Frequency Ordered Stop   02/11/16 0000  ceFAZolin (ANCEF) IVPB 2g/100 mL premix     2 g 200 mL/hr over 30 Minutes Intravenous Every 6 hours 02/10/16 2221 02/11/16 0627   02/10/16 1909  ceFAZolin (ANCEF) 2-4 GM/100ML-% IVPB    Comments:  Haynes Bast   : cabinet override      02/10/16 1909 02/10/16 1924    .  She was given sequential compression devices, early ambulation, and lovenox for DVT prophylaxis.  She benefited maximally from the hospital stay and there were no complications.    Recent vital signs:  Filed Vitals:   02/12/16 2038 02/13/16 0410  BP: 122/50 94/33  Pulse: 82 94  Temp: 98.7 F (37.1 C) 99.2 F (37.3 C)  Resp: 18 16    Recent laboratory studies:  Lab Results  Component Value Date   HGB 8.6* 02/13/2016   HGB 8.5* 02/12/2016   HGB 10.0* 02/11/2016   Lab Results  Component Value Date   WBC 5.5 02/13/2016   PLT 155 02/13/2016   No  results found for: INR Lab Results  Component Value Date   NA 138 02/12/2016   K 4.1 02/12/2016   CL 104 02/12/2016   CO2 27 02/12/2016   BUN <5* 02/12/2016   CREATININE 0.92 02/12/2016   GLUCOSE 86 02/12/2016    Discharge Medications:     Medication List    STOP taking these medications        ALEVE PO      TAKE these medications        ALKA-SELTZER PLUS COLD/COUGH 30-2-10-250 MG Caps  Generic drug:  Pseudoeph-CPM-DM-APAP  Take 1 capsule by mouth as directed.     ALPRAZolam 0.25 MG tablet  Commonly known as:  XANAX  Take 0.25 mg by mouth at bedtime as needed for anxiety.     beclomethasone 80 MCG/ACT inhaler  Commonly known as:  QVAR  Inhale 1 puff into the lungs 2 (two) times daily.     benzonatate 100 MG capsule  Commonly known as:  TESSALON  Take 100 mg by mouth 3 (three) times daily as needed for cough.     cyclobenzaprine 10 MG tablet  Commonly known as:  FLEXERIL  Take 1 tablet (10 mg total) by mouth 3 (three) times daily as needed for muscle spasms.     diphenhydrAMINE 25 MG tablet  Commonly known as:  SOMINEX  Take 25 mg by mouth as needed.     enoxaparin 40 MG/0.4ML injection  Commonly known as:  LOVENOX  Inject 0.4 mLs (40 mg total) into the skin daily.     HYDROcodone-acetaminophen 10-325 MG tablet  Commonly known as:  NORCO  Take 1-2 tablets by mouth every 6 (six) hours as needed.     meclizine 25 MG tablet  Commonly known as:  ANTIVERT  Take 25 mg by mouth 3 (three) times daily as needed for dizziness.     multivitamin with minerals tablet  Take 1 tablet by mouth daily.     omeprazole 20 MG capsule  Commonly known as:  PRILOSEC  Take 20 mg by mouth daily.     sennosides-docusate sodium 8.6-50 MG tablet  Commonly known as:  SENOKOT-S  Take 2 tablets by mouth daily.     traMADol 50 MG tablet  Commonly known as:  ULTRAM  Take 50 mg by mouth every 6 (six) hours as needed for moderate pain.        Diagnostic Studies: Dg Chest 1  View  02/10/2016  CLINICAL DATA:  Right femur fracture.  Pre-op respiratory exam EXAM: CHEST 1 VIEW COMPARISON:  None. FINDINGS: The heart size and mediastinal contours are within normal limits. Both lungs are clear. No evidence of pneumothorax or pleural effusion. Right upper quadrant surgical clips noted as well as anterior cervical spine fusion hardware. IMPRESSION: No active disease. Electronically Signed   By: Earle Gell M.D.   On: 02/10/2016 16:29   Dg Pelvis 1-2 Views  02/10/2016  CLINICAL DATA:  Fall today.  Pelvic pain. EXAM: PELVIS - 1-2 VIEW COMPARISON:  None. FINDINGS: There is no evidence of pelvic fracture or diastasis. No pelvic bone lesions are seen. Mild to moderate bilateral hip osteoarthritis noted as well as lower lumbar spine degenerative changes. IMPRESSION: No evidence of pelvic fracture. Electronically Signed   By: Earle Gell M.D.   On: 02/10/2016 16:29   Dg C-arm 61-120 Min  02/10/2016  CLINICAL DATA:  Portable imaging during ORIF of a right femur fracture. EXAM: RIGHT FEMUR 2 VIEWS; DG C-ARM 61-120 MIN COMPARISON:  02/10/2016 at 1553 hours FINDINGS: The 6 submitted portable images show the placement of an intra medullary rod across the right femoral shaft fracture reducing the major fracture components into near anatomic alignment. The orthopedic hardware appears well seated. A compression screw extends across the femoral neck into the femoral head. There is no new fracture or evidence of an operative complication. IMPRESSION: Well-aligned fracture of the right femoral shaft following ORIF. Electronically Signed   By: Lajean Manes M.D.   On: 02/10/2016 21:28   Dg Femur, Min 2 Views Right  02/10/2016  CLINICAL DATA:  Portable imaging during ORIF of a right femur fracture. EXAM: RIGHT FEMUR 2 VIEWS; DG C-ARM 61-120 MIN COMPARISON:  02/10/2016 at 1553 hours FINDINGS: The 6 submitted portable images show the placement of an intra medullary rod across the right femoral shaft fracture  reducing the major fracture components into near anatomic alignment. The orthopedic hardware appears well seated. A compression screw extends across the femoral neck into the femoral head. There is no new fracture or evidence of an operative complication. IMPRESSION: Well-aligned fracture of the right femoral shaft following ORIF. Electronically Signed   By: Lajean Manes M.D.   On: 02/10/2016 21:28   Dg Femur, Min 2 Views Right  02/10/2016  CLINICAL DATA:  Fall today. Right femur pain and fracture deformity. Initial encounter.  EXAM: RIGHT FEMUR 2 VIEWS COMPARISON:  None. FINDINGS: A mildly comminuted oblique fracture of the mid femoral diaphysis is seen with medial and posterior displacement and angulation of the distal fracture fragment. Mild right hip osteoarthritis also demonstrated. IMPRESSION: Mid femoral shaft fracture, as described above. Electronically Signed   By: Earle Gell M.D.   On: 02/10/2016 16:28    Disposition:       Discharge Instructions    Weight bearing as tolerated    Complete by:  As directed            Follow-up Information    Follow up with Johnny Bridge, MD. Schedule an appointment as soon as possible for a visit in 2 weeks.   Specialty:  Orthopedic Surgery   Contact information:   [place] [address] [phone]       Follow up with [facility].   Why:  They will contact you to schedule home therapy visits.   Contact information:   [address] [place] [address] [phone]        Signed: Johnny Bridge 02/13/2016, 2:25 PM

## 2016-02-13 NOTE — Progress Notes (Signed)
Physical Therapy Treatment Patient Details Name: Suzanne Barton MRN: [id number] DOB: 04-Oct-1950 Today's Date: 02/13/2016    History of Present Illness Suzanne Barton is a 65 y.o. female s/p fall with resultant right femur fx and now s/p IM nail    PT Comments    Pt is making great progress toward goals. Patient safe to D/C from a mobility standpoint based on progression towards goals set on PT eval.   Follow Up Recommendations  No PT follow up;Supervision - Intermittent     Equipment Recommendations  Rolling walker with 5" wheels    Precautions / Restrictions Precautions Precautions: Fall Restrictions RLE Weight Bearing: Weight bearing as tolerated    Mobility  Bed Mobility Overal bed mobility: Needs Assistance       Supine to sit: Supervision Sit to supine: Supervision   General bed mobility comments: bed flat and no rails used. pt educaton on use of blue leg lifter for moving her right leg around and in/out of bed. Spouse plans to purchase one today before they discharge home  Transfers Overall transfer level: Modified independent Equipment used: Rolling walker (2 wheeled) Transfers: Sit to/from Stand Sit to Stand: Modified independent (Device/Increase time)         General transfer comment: demo'd safe technique with increased time needed.  Ambulation/Gait Ambulation/Gait assistance: Supervision Ambulation Distance (Feet): 130 Feet Assistive device: Rolling walker (2 wheeled) Gait Pattern/deviations: Step-to pattern;Antalgic;Decreased stride length;Decreased step length - left;Decreased stance time - right Gait velocity: decreased Gait velocity interpretation: Below normal speed for age/gender General Gait Details: cues to correct gait deviations and for decreased UE weight bearing on walker. 3 standing rest breaks taken to relieve UE pressure. no balance issues noted.       Cognition Arousal/Alertness: Awake/alert Behavior During Therapy: WFL  for tasks assessed/performed Overall Cognitive Status: Within Functional Limits for tasks assessed                      Exercises Total Joint Exercises Ankle Circles/Pumps: AROM;Both;10 reps;Supine Quad Sets: AROM;Strengthening;Right;10 reps;Supine Heel Slides: AAROM;Strengthening;Right;10 reps;Supine Straight Leg Raises: AAROM;Strengthening;Right;10 reps;Supine     Pertinent Vitals/Pain Pain Assessment: 0-10 Pain Score: 5  Pain Location: right knee Pain Descriptors / Indicators: Aching;Sore Pain Intervention(s): Limited activity within patient's tolerance     PT Goals (current goals can now be found in the care plan section) Acute Rehab PT Goals Patient Stated Goal: to go home PT Goal Formulation: With patient Time For Goal Achievement: 02/18/16 Potential to Achieve Goals: Good Progress towards PT goals: Progressing toward goals    Frequency  Min 5X/week    PT Plan Current plan remains appropriate    End of Session Equipment Utilized During Treatment: Gait belt;Other (comment) (blue leg lifter) Activity Tolerance: Patient tolerated treatment well;Patient limited by pain;Patient limited by fatigue Patient left: in bed;with call bell/phone within reach;with family/visitor present     Time: 1023-1050 PT Time Calculation (min) (ACUTE ONLY): 27 min  Charges:  $Gait Training: 8-22 mins $Therapeutic Exercise: 8-22 mins           Willow Ora 02/13/2016, 11:04 AM   Willow Ora, PTA, CLT Acute Rehab Services [phone] 02/13/2016, 11:04 AM

## 2016-02-13 NOTE — Progress Notes (Signed)
Discharge instruction gave to pt and all question answered. Pt is ready to go.

## 2016-02-15 NOTE — Anesthesia Postprocedure Evaluation (Signed)
Anesthesia Post Note  Patient: Suzanne Barton  Procedure(s) Performed: Procedure(s) (LRB): INTRAMEDULLARY (IM) NAIL FEMORAL (Right)  Patient location during evaluation: PACU Anesthesia Type: Spinal Level of consciousness: awake Pain management: pain level controlled Vital Signs Assessment: post-procedure vital signs reviewed and stable Respiratory status: spontaneous breathing Cardiovascular status: stable Postop Assessment: no signs of nausea or vomiting and spinal receding Anesthetic complications: no    Last Vitals:  Filed Vitals:   02/12/16 2038 02/13/16 0410  BP: 122/50 94/33  Pulse: 82 94  Temp: 37.1 C 37.3 C  Resp: 18 16    Last Pain:  Filed Vitals:   02/13/16 0504  PainSc: Asleep                 Ivon Oelkers

## 2018-02-18 ENCOUNTER — Ambulatory Visit
Admission: RE | Admit: 2018-02-18 | Discharge: 2018-02-18 | Disposition: A | Discharge status: Home / Self Care | Payer: BLUE CROSS/BLUE SHIELD | Source: Ambulatory Visit | Attending: Family Medicine | Admitting: Family Medicine

## 2018-02-18 ENCOUNTER — Other Ambulatory Visit: Payer: Self-pay | Admitting: Family Medicine

## 2018-02-18 DIAGNOSIS — M7989 Other specified soft tissue disorders: Secondary | ICD-10-CM

## 2018-02-18 DIAGNOSIS — M79644 Pain in right finger(s): Secondary | ICD-10-CM

## 2019-03-03 ENCOUNTER — Encounter (HOSPITAL_BASED_OUTPATIENT_CLINIC_OR_DEPARTMENT_OTHER): Payer: Self-pay | Admitting: *Deleted

## 2019-03-03 ENCOUNTER — Other Ambulatory Visit: Payer: Self-pay

## 2019-03-04 ENCOUNTER — Other Ambulatory Visit (HOSPITAL_COMMUNITY)
Admission: RE | Admit: 2019-03-04 | Discharge: 2019-03-04 | Disposition: A | Discharge status: Home / Self Care | Payer: Medicare Other | Source: Ambulatory Visit | Attending: Orthopedic Surgery | Admitting: Orthopedic Surgery

## 2019-03-04 DIAGNOSIS — Z1159 Encounter for screening for other viral diseases: Secondary | ICD-10-CM | POA: Insufficient documentation

## 2019-03-05 LAB — SARS CORONAVIRUS 2 (TAT 6-24 HRS): SARS Coronavirus 2: NEGATIVE

## 2019-03-07 ENCOUNTER — Ambulatory Visit (HOSPITAL_BASED_OUTPATIENT_CLINIC_OR_DEPARTMENT_OTHER)
Admission: RE | Admit: 2019-03-07 | Discharge: 2019-03-07 | Disposition: A | Discharge status: Home / Self Care | Payer: BC Managed Care – PPO | Attending: Orthopedic Surgery | Admitting: Orthopedic Surgery

## 2019-03-07 ENCOUNTER — Encounter (HOSPITAL_BASED_OUTPATIENT_CLINIC_OR_DEPARTMENT_OTHER)
Admission: RE | Disposition: A | Discharge status: Home / Self Care | Payer: Self-pay | Source: Home / Self Care | Attending: Orthopedic Surgery

## 2019-03-07 ENCOUNTER — Ambulatory Visit (HOSPITAL_BASED_OUTPATIENT_CLINIC_OR_DEPARTMENT_OTHER): Payer: BC Managed Care – PPO | Admitting: Anesthesiology

## 2019-03-07 ENCOUNTER — Encounter (HOSPITAL_BASED_OUTPATIENT_CLINIC_OR_DEPARTMENT_OTHER): Payer: Self-pay

## 2019-03-07 ENCOUNTER — Other Ambulatory Visit: Payer: Self-pay

## 2019-03-07 DIAGNOSIS — Z8619 Personal history of other infectious and parasitic diseases: Secondary | ICD-10-CM | POA: Diagnosis not present

## 2019-03-07 DIAGNOSIS — Z7951 Long term (current) use of inhaled steroids: Secondary | ICD-10-CM | POA: Insufficient documentation

## 2019-03-07 DIAGNOSIS — Z886 Allergy status to analgesic agent status: Secondary | ICD-10-CM | POA: Insufficient documentation

## 2019-03-07 DIAGNOSIS — Z9049 Acquired absence of other specified parts of digestive tract: Secondary | ICD-10-CM | POA: Insufficient documentation

## 2019-03-07 DIAGNOSIS — S82041A Displaced comminuted fracture of right patella, initial encounter for closed fracture: Secondary | ICD-10-CM | POA: Insufficient documentation

## 2019-03-07 DIAGNOSIS — S82001A Unspecified fracture of right patella, initial encounter for closed fracture: Secondary | ICD-10-CM | POA: Diagnosis present

## 2019-03-07 DIAGNOSIS — K449 Diaphragmatic hernia without obstruction or gangrene: Secondary | ICD-10-CM | POA: Insufficient documentation

## 2019-03-07 DIAGNOSIS — Z8249 Family history of ischemic heart disease and other diseases of the circulatory system: Secondary | ICD-10-CM | POA: Diagnosis not present

## 2019-03-07 DIAGNOSIS — Z888 Allergy status to other drugs, medicaments and biological substances status: Secondary | ICD-10-CM | POA: Insufficient documentation

## 2019-03-07 DIAGNOSIS — X58XXXA Exposure to other specified factors, initial encounter: Secondary | ICD-10-CM | POA: Diagnosis not present

## 2019-03-07 DIAGNOSIS — K219 Gastro-esophageal reflux disease without esophagitis: Secondary | ICD-10-CM | POA: Insufficient documentation

## 2019-03-07 DIAGNOSIS — Z808 Family history of malignant neoplasm of other organs or systems: Secondary | ICD-10-CM | POA: Diagnosis not present

## 2019-03-07 DIAGNOSIS — Z880 Allergy status to penicillin: Secondary | ICD-10-CM | POA: Insufficient documentation

## 2019-03-07 DIAGNOSIS — Z90710 Acquired absence of both cervix and uterus: Secondary | ICD-10-CM | POA: Insufficient documentation

## 2019-03-07 DIAGNOSIS — Z882 Allergy status to sulfonamides status: Secondary | ICD-10-CM | POA: Insufficient documentation

## 2019-03-07 DIAGNOSIS — Z881 Allergy status to other antibiotic agents status: Secondary | ICD-10-CM | POA: Diagnosis not present

## 2019-03-07 DIAGNOSIS — Z801 Family history of malignant neoplasm of trachea, bronchus and lung: Secondary | ICD-10-CM | POA: Insufficient documentation

## 2019-03-07 DIAGNOSIS — E785 Hyperlipidemia, unspecified: Secondary | ICD-10-CM | POA: Insufficient documentation

## 2019-03-07 DIAGNOSIS — D649 Anemia, unspecified: Secondary | ICD-10-CM | POA: Diagnosis not present

## 2019-03-07 HISTORY — PX: ORIF PATELLA: SHX5033

## 2019-03-07 HISTORY — DX: Other seasonal allergic rhinitis: J30.2

## 2019-03-07 HISTORY — DX: Unspecified fracture of right patella, initial encounter for closed fracture: S82.001A

## 2019-03-07 SURGERY — OPEN REDUCTION INTERNAL FIXATION (ORIF) PATELLA
Anesthesia: General | Site: Knee | Laterality: Right

## 2019-03-07 MED ORDER — FENTANYL CITRATE (PF) 100 MCG/2ML IJ SOLN: 25.0000 ug | INTRAMUSCULAR | Status: DC | PRN

## 2019-03-07 MED ORDER — FENTANYL CITRATE (PF) 100 MCG/2ML IJ SOLN
INTRAMUSCULAR | Status: AC
  Filled 2019-03-07: qty 2

## 2019-03-07 MED ORDER — ONDANSETRON HCL 4 MG/2ML IJ SOLN
INTRAMUSCULAR | Status: DC | PRN
  Administered 2019-03-07: 4 mg via INTRAVENOUS

## 2019-03-07 MED ORDER — PHENYLEPHRINE 40 MCG/ML (10ML) SYRINGE FOR IV PUSH (FOR BLOOD PRESSURE SUPPORT)
PREFILLED_SYRINGE | INTRAVENOUS | Status: AC
  Filled 2019-03-07: qty 10

## 2019-03-07 MED ORDER — CHLORHEXIDINE GLUCONATE 4 % EX LIQD: 60.0000 mL | Freq: Once | CUTANEOUS | Status: DC

## 2019-03-07 MED ORDER — ACETAMINOPHEN 500 MG PO TABS
1000.0000 mg | ORAL_TABLET | Freq: Once | ORAL | Status: AC
  Administered 2019-03-07: 1000 mg via ORAL

## 2019-03-07 MED ORDER — PROPOFOL 10 MG/ML IV BOLUS
INTRAVENOUS | Status: DC | PRN
  Administered 2019-03-07: 50 mg via INTRAVENOUS
  Administered 2019-03-07: 150 mg via INTRAVENOUS

## 2019-03-07 MED ORDER — PROPOFOL 10 MG/ML IV BOLUS
INTRAVENOUS | Status: AC
  Filled 2019-03-07: qty 20

## 2019-03-07 MED ORDER — CLONIDINE HCL (ANALGESIA) 100 MCG/ML EP SOLN
EPIDURAL | Status: DC | PRN
  Administered 2019-03-07: 50 ug

## 2019-03-07 MED ORDER — FENTANYL CITRATE (PF) 100 MCG/2ML IJ SOLN
50.0000 ug | INTRAMUSCULAR | Status: DC | PRN
  Administered 2019-03-07: 50 ug via INTRAVENOUS

## 2019-03-07 MED ORDER — LACTATED RINGERS IV SOLN
INTRAVENOUS | Status: DC
  Administered 2019-03-07 (×2): via INTRAVENOUS

## 2019-03-07 MED ORDER — LIDOCAINE 2% (20 MG/ML) 5 ML SYRINGE
INTRAMUSCULAR | Status: AC
  Filled 2019-03-07: qty 5

## 2019-03-07 MED ORDER — BUPIVACAINE-EPINEPHRINE (PF) 0.5% -1:200000 IJ SOLN
INTRAMUSCULAR | Status: DC | PRN
  Administered 2019-03-07: 30 mL via PERINEURAL

## 2019-03-07 MED ORDER — DEXAMETHASONE SODIUM PHOSPHATE 10 MG/ML IJ SOLN
INTRAMUSCULAR | Status: AC
  Filled 2019-03-07: qty 1

## 2019-03-07 MED ORDER — ACETAMINOPHEN 500 MG PO TABS
ORAL_TABLET | ORAL | Status: AC
  Filled 2019-03-07: qty 2

## 2019-03-07 MED ORDER — PHENYLEPHRINE 40 MCG/ML (10ML) SYRINGE FOR IV PUSH (FOR BLOOD PRESSURE SUPPORT)
PREFILLED_SYRINGE | INTRAVENOUS | Status: DC | PRN
  Administered 2019-03-07: 80 ug via INTRAVENOUS
  Administered 2019-03-07: 120 ug via INTRAVENOUS
  Administered 2019-03-07: 80 ug via INTRAVENOUS
  Administered 2019-03-07: 120 ug via INTRAVENOUS

## 2019-03-07 MED ORDER — MIDAZOLAM HCL 2 MG/2ML IJ SOLN
INTRAMUSCULAR | Status: AC
  Filled 2019-03-07: qty 2

## 2019-03-07 MED ORDER — ONDANSETRON HCL 4 MG/2ML IJ SOLN: 4.0000 mg | Freq: Once | INTRAMUSCULAR | Status: DC | PRN

## 2019-03-07 MED ORDER — DEXAMETHASONE SODIUM PHOSPHATE 10 MG/ML IJ SOLN
INTRAMUSCULAR | Status: DC | PRN
  Administered 2019-03-07: 5 mg via INTRAVENOUS

## 2019-03-07 MED ORDER — CEFAZOLIN SODIUM-DEXTROSE 2-4 GM/100ML-% IV SOLN: 2.0000 g | INTRAVENOUS | Status: DC

## 2019-03-07 MED ORDER — 0.9 % SODIUM CHLORIDE (POUR BTL) OPTIME
TOPICAL | Status: DC | PRN
  Administered 2019-03-07: 200 mL

## 2019-03-07 MED ORDER — CEFAZOLIN SODIUM-DEXTROSE 2-4 GM/100ML-% IV SOLN
INTRAVENOUS | Status: AC
  Filled 2019-03-07: qty 100

## 2019-03-07 MED ORDER — ONDANSETRON HCL 4 MG/2ML IJ SOLN
INTRAMUSCULAR | Status: AC
  Filled 2019-03-07: qty 2

## 2019-03-07 MED ORDER — MIDAZOLAM HCL 2 MG/2ML IJ SOLN
1.0000 mg | INTRAMUSCULAR | Status: DC | PRN
  Administered 2019-03-07: 1 mg via INTRAVENOUS

## 2019-03-07 MED ORDER — LIDOCAINE 2% (20 MG/ML) 5 ML SYRINGE
INTRAMUSCULAR | Status: DC | PRN
  Administered 2019-03-07: 20 mg via INTRAVENOUS

## 2019-03-07 MED ORDER — HYDROCODONE-ACETAMINOPHEN 10-325 MG PO TABS: 1.0000 | ORAL_TABLET | Freq: Four times a day (QID) | ORAL | 0 refills | Status: DC | PRN

## 2019-03-07 MED ORDER — SCOPOLAMINE 1 MG/3DAYS TD PT72: 1.0000 | MEDICATED_PATCH | Freq: Once | TRANSDERMAL | Status: DC

## 2019-03-07 SURGICAL SUPPLY — 80 items
BANDAGE ACE 4X5 VEL STRL LF (GAUZE/BANDAGES/DRESSINGS) ×1 IMPLANT
BANDAGE ACE 6X5 VEL STRL LF (GAUZE/BANDAGES/DRESSINGS) ×3 IMPLANT
BANDAGE ESMARK 6X9 LF (GAUZE/BANDAGES/DRESSINGS) ×1 IMPLANT
BIT DRILL 2.6 CANN (BIT) ×2 IMPLANT
BLADE SURG 15 STRL LF DISP TIS (BLADE) ×2 IMPLANT
BLADE SURG 15 STRL SS (BLADE) ×4
BNDG ESMARK 6X9 LF (GAUZE/BANDAGES/DRESSINGS) ×3
BNDG GAUZE ELAST 4 BULKY (GAUZE/BANDAGES/DRESSINGS) IMPLANT
CANISTER SUCT 1200ML W/VALVE (MISCELLANEOUS) ×3 IMPLANT
CLOSURE STERI-STRIP 1/2X4 (GAUZE/BANDAGES/DRESSINGS) ×1
CLSR STERI-STRIP ANTIMIC 1/2X4 (GAUZE/BANDAGES/DRESSINGS) ×1 IMPLANT
COVER WAND RF STERILE (DRAPES) IMPLANT
CUFF TOURN SGL QUICK 34 (TOURNIQUET CUFF) ×2
CUFF TRNQT CYL 34X4.125X (TOURNIQUET CUFF) IMPLANT
DECANTER SPIKE VIAL GLASS SM (MISCELLANEOUS) IMPLANT
DRAPE C-ARM 42X72 X-RAY (DRAPES) ×1 IMPLANT
DRAPE C-ARMOR (DRAPES) ×1 IMPLANT
DRAPE EXTREMITY T 121X128X90 (DISPOSABLE) ×3 IMPLANT
DRAPE INCISE IOBAN 66X45 STRL (DRAPES) IMPLANT
DRAPE OEC MINIVIEW 54X84 (DRAPES) ×2 IMPLANT
DRAPE U-SHAPE 47X51 STRL (DRAPES) ×3 IMPLANT
DRSG MEPILEX BORDER 4X8 (GAUZE/BANDAGES/DRESSINGS) ×2 IMPLANT
DURAPREP 26ML APPLICATOR (WOUND CARE) ×3 IMPLANT
ELECT REM PT RETURN 9FT ADLT (ELECTROSURGICAL) ×3
ELECTRODE REM PT RTRN 9FT ADLT (ELECTROSURGICAL) ×1 IMPLANT
GAUZE SPONGE 4X4 12PLY STRL (GAUZE/BANDAGES/DRESSINGS) ×3 IMPLANT
GLOVE BIO SURGEON STRL SZ8 (GLOVE) ×3 IMPLANT
GLOVE BIOGEL PI IND STRL 8 (GLOVE) ×2 IMPLANT
GLOVE BIOGEL PI INDICATOR 8 (GLOVE) ×4
GLOVE ORTHO TXT STRL SZ7.5 (GLOVE) ×3 IMPLANT
GOWN STRL REUS W/ TWL LRG LVL3 (GOWN DISPOSABLE) ×1 IMPLANT
GOWN STRL REUS W/ TWL XL LVL3 (GOWN DISPOSABLE) ×2 IMPLANT
GOWN STRL REUS W/TWL LRG LVL3 (GOWN DISPOSABLE) ×2
GOWN STRL REUS W/TWL XL LVL3 (GOWN DISPOSABLE) ×4
IMMOBILIZER KNEE 22 UNIV (SOFTGOODS) IMPLANT
IMMOBILIZER KNEE 24 THIGH 36 (MISCELLANEOUS) IMPLANT
IMMOBILIZER KNEE 24 UNIV (MISCELLANEOUS)
K-WIRE 1.6X200 (WIRE) ×6
KWIRE 1.6X200 (WIRE) IMPLANT
NDL KEITH (NEEDLE) IMPLANT
NDL MENISCAL REPAIR W/EYELT (NEEDLE) IMPLANT
NEEDLE KEITH (NEEDLE) IMPLANT
NEEDLE MENISCAL REPAIR W/EYELT (NEEDLE) ×3 IMPLANT
NS IRRIG 1000ML POUR BTL (IV SOLUTION) ×3 IMPLANT
PACK ARTHROSCOPY DSU (CUSTOM PROCEDURE TRAY) ×3 IMPLANT
PACK BASIN DAY SURGERY FS (CUSTOM PROCEDURE TRAY) ×3 IMPLANT
PAD CAST 4YDX4 CTTN HI CHSV (CAST SUPPLIES) ×1 IMPLANT
PADDING CAST ABS 4INX4YD NS (CAST SUPPLIES)
PADDING CAST ABS COTTON 4X4 ST (CAST SUPPLIES) ×1 IMPLANT
PADDING CAST COTTON 4X4 STRL (CAST SUPPLIES) ×2
PENCIL BUTTON HOLSTER BLD 10FT (ELECTRODE) ×3 IMPLANT
SCREW 4X36MM CANN LO-PRO (Screw) ×4 IMPLANT
SLEEVE SCD COMPRESS KNEE MED (MISCELLANEOUS) ×3 IMPLANT
SPLINT FAST PLASTER 5X30 (CAST SUPPLIES) ×40
SPLINT PLASTER CAST FAST 5X30 (CAST SUPPLIES) IMPLANT
SPONGE LAP 18X18 RF (DISPOSABLE) ×3 IMPLANT
STAPLER VISISTAT 35W (STAPLE) IMPLANT
SUCTION FRAZIER HANDLE 10FR (MISCELLANEOUS) ×2
SUCTION TUBE FRAZIER 10FR DISP (MISCELLANEOUS) ×1 IMPLANT
SUT ETHILON 3 0 PS 1 (SUTURE) IMPLANT
SUT ETHILON 4 0 PS 2 18 (SUTURE) IMPLANT
SUT FIBERWIRE #2 38 T-5 BLUE (SUTURE)
SUT FIBERWIRE #5 38 CONV NDL (SUTURE)
SUT MNCRL AB 4-0 PS2 18 (SUTURE) IMPLANT
SUT VIC AB 0 CT1 27 (SUTURE) ×2
SUT VIC AB 0 CT1 27XBRD ANBCTR (SUTURE) ×1 IMPLANT
SUT VIC AB 2-0 SH 18 (SUTURE) IMPLANT
SUT VIC AB 2-0 SH 27 (SUTURE)
SUT VIC AB 2-0 SH 27XBRD (SUTURE) IMPLANT
SUT VIC AB 3-0 SH 27 (SUTURE) ×2
SUT VIC AB 3-0 SH 27X BRD (SUTURE) IMPLANT
SUT VICRYL 3-0 CR8 SH (SUTURE) IMPLANT
SUT VICRYL 4-0 PS2 18IN ABS (SUTURE) IMPLANT
SUTURE FIBERWR #2 38 T-5 BLUE (SUTURE) ×2 IMPLANT
SUTURE FIBERWR #5 38 CONV NDL (SUTURE) IMPLANT
SUTURE TAPE 1.3 40 TPR END (SUTURE) IMPLANT
SUTURETAPE 1.3 40 TPR END (SUTURE) ×6
SYR BULB 3OZ (MISCELLANEOUS) ×3 IMPLANT
UNDERPAD 30X30 (UNDERPADS AND DIAPERS) ×3 IMPLANT
YANKAUER SUCT BULB TIP NO VENT (SUCTIONS) IMPLANT

## 2019-03-07 NOTE — Discharge Instructions (Signed)
Diet: As you were doing prior to hospitalization   Shower:  May shower but keep the wounds dry, use an occlusive plastic wrap, NO SOAKING IN TUB.  If the bandage gets wet, change with a clean dry gauze.  If you have a splint on, leave the splint in place and keep the splint dry with a plastic bag.  Dressing:  You may change your dressing 3-5 days after surgery, unless you have a splint.  If you have a splint, then just leave the splint in place and we will change your bandages during your first follow-up appointment.    If you had hand or foot surgery, we will plan to remove your stitches in about 2 weeks in the office.  For all other surgeries, there are sticky tapes (steri-strips) on your wounds and all the stitches are absorbable.  Leave the steri-strips in place when changing your dressings, they will peel off with time, usually 2-3 weeks.  Activity:  Increase activity slowly as tolerated, but follow the weight bearing instructions below.  The rules on driving is that you can not be taking narcotics while you drive, and you must feel in control of the vehicle.    Weight Bearing:   Touch toe weightbearing, right lower extremity.    To prevent constipation: you may use a stool softener such as -  Colace (over the counter) 100 mg by mouth twice a day  Drink plenty of fluids (prune juice may be helpful) and high fiber foods Miralax (over the counter) for constipation as needed.    Itching:  If you experience itching with your medications, try taking only a single pain pill, or even half a pain pill at a time.  You may take up to 10 pain pills per day, and you can also use benadryl over the counter for itching or also to help with sleep.   Precautions:  If you experience chest pain or shortness of breath - call 911 immediately for transfer to the hospital emergency department!!  If you develop a fever greater that 101 F, purulent drainage from wound, increased redness or drainage from wound, or  calf pain -- Call the office at [phone]                                                Follow- Up Appointment:  Please call for an appointment to be seen in 2 weeks [facility] - [phone]    Central Instructions  NO TYLENOL UNTIL AFTER 5:00PM 03/07/19.  Activity: Get plenty of rest for the remainder of the day. A responsible individual must stay with you for 24 hours following the procedure.  For the next 24 hours, DO NOT: -Drive a car -Paediatric nurse -Drink alcoholic beverages -Take any medication unless instructed by your physician -Make any legal decisions or sign important papers.  Meals: Start with liquid foods such as gelatin or soup. Progress to regular foods as tolerated. Avoid greasy, spicy, heavy foods. If nausea and/or vomiting occur, drink only clear liquids until the nausea and/or vomiting subsides. Call your physician if vomiting continues.  Special Instructions/Symptoms: Your throat may feel dry or sore from the anesthesia or the breathing tube placed in your throat during surgery. If this causes discomfort, gargle with warm salt water. The discomfort should disappear within 24 hours.  If you had a  scopolamine patch placed behind your ear for the management of post- operative nausea and/or vomiting:  1. The medication in the patch is effective for 72 hours, after which it should be removed.  Wrap patch in a tissue and discard in the trash. Wash hands thoroughly with soap and water. 2. You may remove the patch earlier than 72 hours if you experience unpleasant side effects which may include dry mouth, dizziness or visual disturbances. 3. Avoid touching the patch. Wash your hands with soap and water after contact with the patch.      Regional Anesthesia Blocks  1. Numbness or the inability to move the "blocked" extremity may last from 3-48 hours after placement. The length of time depends on the medication injected and your individual  response to the medication. If the numbness is not going away after 48 hours, call your surgeon.  2. The extremity that is blocked will need to be protected until the numbness is gone and the  Strength has returned. Because you cannot feel it, you will need to take extra care to avoid injury. Because it may be weak, you may have difficulty moving it or using it. You may not know what position it is in without looking at it while the block is in effect.  3. For blocks in the legs and feet, returning to weight bearing and walking needs to be done carefully. You will need to wait until the numbness is entirely gone and the strength has returned. You should be able to move your leg and foot normally before you try and bear weight or walk. You will need someone to be with you when you first try to ensure you do not fall and possibly risk injury.  4. Bruising and tenderness at the needle site are common side effects and will resolve in a few days.  5. Persistent numbness or new problems with movement should be communicated to the surgeon or the [facility] [phone] [place] [phone]).

## 2019-03-07 NOTE — Anesthesia Procedure Notes (Signed)
Procedure Name: LMA Insertion Date/Time: 03/07/2019 1:05 PM Performed by: Myna Bright, CRNA Pre-anesthesia Checklist: Patient identified, Emergency Drugs available, Suction available and Patient being monitored Patient Re-evaluated:Patient Re-evaluated prior to induction Oxygen Delivery Method: Circle system utilized Preoxygenation: Pre-oxygenation with 100% oxygen Induction Type: IV induction Ventilation: Mask ventilation without difficulty LMA: LMA inserted LMA Size: 4.0 Tube type: Oral Number of attempts: 1 Placement Confirmation: positive ETCO2 and breath sounds checked- equal and bilateral Tube secured with: Tape Dental Injury: Teeth and Oropharynx as per pre-operative assessment

## 2019-03-07 NOTE — Anesthesia Preprocedure Evaluation (Signed)
Anesthesia Evaluation  Patient identified by MRN, date of birth, ID band Patient awake    Reviewed: Allergy & Precautions, NPO status , Patient's Chart, lab work & pertinent test results  Airway Mallampati: II  TM Distance: >3 FB     Dental  (+) Dental Advisory Given   Pulmonary neg pulmonary ROS,    breath sounds clear to auscultation       Cardiovascular negative cardio ROS   Rhythm:Regular Rate:Normal     Neuro/Psych negative neurological ROS     GI/Hepatic GERD  ,(+) Hepatitis -, C  Endo/Other  negative endocrine ROS  Renal/GU negative Renal ROS     Musculoskeletal   Abdominal   Peds  Hematology negative hematology ROS (+)   Anesthesia Other Findings   Reproductive/Obstetrics                             Anesthesia Physical Anesthesia Plan  ASA: II  Anesthesia Plan: General   Post-op Pain Management:  Regional for Post-op pain   Induction: Intravenous  PONV Risk Score and Plan: 3 and Dexamethasone, Ondansetron and Treatment may vary due to age or medical condition  Airway Management Planned: LMA  Additional Equipment:   Intra-op Plan:   Post-operative Plan: Extubation in OR  Informed Consent: I have reviewed the patients History and Physical, chart, labs and discussed the procedure including the risks, benefits and alternatives for the proposed anesthesia with the patient or authorized representative who has indicated his/her understanding and acceptance.     Dental advisory given  Plan Discussed with: CRNA  Anesthesia Plan Comments:         Anesthesia Quick Evaluation

## 2019-03-07 NOTE — Progress Notes (Signed)
Assisted Dr. Rodman Comp with right, ultrasound guided, femoral block. Side rails up, monitors on throughout procedure. See vital signs in flow sheet. Tolerated Procedure well.

## 2019-03-07 NOTE — Transfer of Care (Signed)
Immediate Anesthesia Transfer of Care Note  Patient: Suzanne Barton  Procedure(s) Performed: OPEN REDUCTION INTERNAL (ORIF) FIXATION PATELLA (Right Knee)  Patient Location: PACU  Anesthesia Type:GA combined with regional for post-op pain  Level of Consciousness: awake, alert , oriented and patient cooperative  Airway & Oxygen Therapy: Patient Spontanous Breathing and Patient connected to nasal cannula oxygen  Post-op Assessment: Report given to RN, Post -op Vital signs reviewed and stable and Patient moving all extremities  Post vital signs: Reviewed and stable  Last Vitals:  Vitals Value Taken Time  BP 131/57 03/07/19 1413  Temp    Pulse 74 03/07/19 1415  Resp 20 03/07/19 1415  SpO2 100 % 03/07/19 1415  Vitals shown include unvalidated device data.  Last Pain:  Vitals:   03/07/19 1056  TempSrc: Oral  PainSc: 0-No pain         Complications: No apparent anesthesia complications

## 2019-03-07 NOTE — H&P (Signed)
PREOPERATIVE H&P  Chief Complaint: Right knee pain  HPI: Suzanne Barton is a 68 y.o. female who presents for preoperative history and physical with a diagnosis of right displaced patella fracture. Symptoms are rated as moderate to severe, and have been worsening.  This is significantly impairing activities of daily living.  She has elected for surgical management.  She fell on February 28, 2019.  Referred here for follow-up.  I have operated on her right leg before with a hip fracture.  Pain is rated 8/10, using hydrocodone.  Past Medical History:  Diagnosis Date  . Allergy   . Anemia   . Blood transfusion   . Femur fracture, right ([place]) 02/10/2016  . GERD (gastroesophageal reflux disease)   . Hepatitis C 1992   in remission  . Hyperlipidemia    no meds  . Neuromuscular disorder ([place])    hiatal hernia  . Right patella fracture   . Seasonal allergies    Past Surgical History:  Procedure Laterality Date  . APPENDECTOMY    . CHOLECYSTECTOMY    . cyst removed     x3- under right arm, x3- groin area- as a teenager  . FEMUR IM NAIL Right 02/10/2016   Procedure: INTRAMEDULLARY (IM) NAIL FEMORAL;  Surgeon: Marchia Bond, MD;  Location: [facility];  Service: Orthopedics;  Laterality: Right;  . FRACTURE SURGERY  1973   right femur  . FRACTURE SURGERY     left thumb  . knee arthoscopy     x3  . LIVER BIOPSY     x12  . OVARIAN CYST SURGERY    . PARTIAL HYSTERECTOMY     had partial first, then later had all female organs removed  . SPINE SURGERY  11-13-1999   vertebrae fusion  . TONSILLECTOMY AND ADENOIDECTOMY    . TUBAL LIGATION     Social History   Socioeconomic History  . Marital status: Married    Spouse name: Not on file  . Number of children: Not on file  . Years of education: Not on file  . Highest education level: Not on file  Occupational History  . Occupation: home maker  Social Needs  . Financial resource strain: Not on file  . Food insecurity    Worry: Not on file   Inability: Not on file  . Transportation needs    Medical: Not on file    Non-medical: Not on file  Tobacco Use  . Smoking status: Never Smoker  . Smokeless tobacco: Never Used  . Tobacco comment: husband smoked x 13 yrs  Substance and Sexual Activity  . Alcohol use: No  . Drug use: No  . Sexual activity: Not on file  Lifestyle  . Physical activity    Days per week: Not on file    Minutes per session: Not on file  . Stress: Not on file  Relationships  . Social Herbalist on phone: Not on file    Gets together: Not on file    Attends religious service: Not on file    Active member of club or organization: Not on file    Attends meetings of clubs or organizations: Not on file    Relationship status: Not on file  Other Topics Concern  . Not on file  Social History Narrative  . Not on file   Family History  Problem Relation Age of Onset  . Heart disease Father   . Heart failure Father   . Lung cancer Mother   .  Cancer Mother        lung and brain  . COPD Sister   . Lung cancer Sister   . Colon cancer Neg Hx   . Esophageal cancer Neg Hx   . Stomach cancer Neg Hx   . Rectal cancer Neg Hx    Allergies  Allergen Reactions  . Tussionex Pennkinetic Er [Hydrocod Polst-Cpm Polst Er] Hives  . Amoxicillin Rash    2 GM CEFAZOLIN ADMINISTERED WITHOUT ANY DIFFICULTIES OR REACTIONS 02/10/2016 @ 1900  . Ibuprofen Rash  . Sulfa Antibiotics Rash   Prior to Admission medications   Medication Sig Start Date End Date Taking? Authorizing Provider  HYDROcodone-acetaminophen (NORCO) 10-325 MG tablet Take 1-2 tablets by mouth every 6 (six) hours as needed. 02/10/16  Yes Marchia Bond, MD  naproxen sodium (ALEVE) 220 MG tablet Take 220 mg by mouth.   Yes [provider]  beclomethasone (QVAR) 80 MCG/ACT inhaler Inhale 1 puff into the lungs 2 (two) times daily. 02/09/11   Brand Males, MD  Pseudoeph-CPM-DM-APAP (ALKA-SELTZER PLUS COLD/COUGH) 30-2-10-250 MG CAPS Take 1  capsule by mouth as directed.      [provider]     Positive ROS: All other systems have been reviewed and were otherwise negative with the exception of those mentioned in the HPI and as above.  Physical Exam: General: Alert, no acute distress Cardiovascular: No pedal edema Respiratory: No cyanosis, no use of accessory musculature GI: No organomegaly, abdomen is soft and non-tender Skin: No lesions in the area of chief complaint Neurologic: Sensation intact distally Psychiatric: Patient is competent for consent with normal mood and affect Lymphatic: No axillary or cervical lymphadenopathy  MUSCULOSKELETAL: Right knee has positive ecchymosis, previous well-healed surgical wounds from the femur ORIF, positive pain to palpation over the patella  Assessment: Displaced right patella fracture   Plan: Plan for Procedure(s): OPEN REDUCTION INTERNAL (ORIF) FIXATION PATELLA  The risks benefits and alternatives were discussed with the patient including but not limited to the risks of nonoperative treatment, versus surgical intervention including infection, bleeding, nerve injury, malunion, nonunion, the need for revision surgery, hardware prominence, hardware failure, the need for hardware removal, blood clots, cardiopulmonary complications, morbidity, mortality, among others, and they were willing to proceed.      Johnny Bridge, MD Cell [phone]   03/07/2019 12:32 PM

## 2019-03-07 NOTE — Anesthesia Procedure Notes (Addendum)
Anesthesia Regional Block: Femoral nerve block   Pre-Anesthetic Checklist: ,, timeout performed, Correct Patient, Correct Site, Correct Laterality, Correct Procedure, Correct Position, site marked, Risks and benefits discussed,  Surgical consent,  Pre-op evaluation,  At surgeon's request and post-op pain management  Laterality: Right  Prep: chloraprep       Needles:  Injection technique: Single-shot  Needle Type: Echogenic Stimulator Needle     Needle Length: 9cm  Needle Gauge: 21     Additional Needles:   Procedures:, nerve stimulator,,, ultrasound used (permanent image in chart),,,,   Nerve Stimulator or Paresthesia:  Response: quad, 0.5 mA,   Additional Responses:   Narrative:  Start time: 03/07/2019 12:35 PM End time: 03/07/2019 12:40 PM Injection made incrementally with aspirations every 5 mL.  Performed by: Personally  Anesthesiologist: Suzette Battiest, MD

## 2019-03-07 NOTE — Op Note (Signed)
03/07/2019  1:53 PM  PATIENT:  Mardene Celeste L Bertling    PRE-OPERATIVE DIAGNOSIS: Displaced comminuted right patella fracture  POST-OPERATIVE DIAGNOSIS:  Same  PROCEDURE:  OPEN REDUCTION INTERNAL (ORIF) FIXATION PATELLA  SURGEON:  Johnny Bridge, MD  PHYSICIAN ASSISTANT: Joya Gaskins, OPA-C, present and scrubbed throughout the case, critical for completion in a timely fashion, and for retraction, instrumentation, and closure.  ANESTHESIA:   General with femoral nerve block  ESTIMATED BLOOD LOSS: Minimal  PREOPERATIVE INDICATIONS:  Jacquelinne Speak [place] is a  68 y.o. female with a diagnosis of [place] who elected for surgical management in order to restore the function of the extensor mechanism.    The risks benefits and alternatives were discussed with the patient preoperatively including but not limited to the risks of infection, bleeding, nerve injury, cardiopulmonary complications, the need for revision surgery, hardware prominence, hardware failure, the need for hardware removal, nonunion, malunion, posttraumatic arthritis, stiffness, loss of strength and function, among others, and the patient was willing to proceed.  OPERATIVE IMPLANTS: 4.0 mm cannulated screws from [employer] x2 with a total of 2 #2 Suture Tape going through the cannulated screws in a figure-of-eight cerclage fashion  OPERATIVE FINDINGS: Displaced patella fracture  OPERATIVE PROCEDURE: The patient was brought to the operating room and placed in the supine position. General anesthesia was administered. IV antibiotics were given. The lower extremity was prepped and draped in usual sterile fashion. The leg was elevated and exsanguinated and the tourniquet was inflated. Time out was performed.   Anterior incision was made over the patella and the fracture fragments identified and cleaned of hematoma. The retinaculum was minimally torn on either side.  I reduced the fracture anatomically and held provisionally  with a clamp and placed 2 guidewires for the cannulated screws.  I improved the articular reduction of the free piece by flexing the knee and getting a congruous alignment of the articular comminution.    The lengths were measured, after being confirmed on C-arm, and then I placed the screws, bicortically, these were blunt tipped screws.  C-arm used to confirm reduction and position of the screws, and once I was satisfied with this I then used a Keith needle through the screws bringing a total of 2 #2 FiberWire in a figure-of-eight type fashion. This provided excellent secondary fixation. I left the screw lengths slightly short of the far cortex in order to minimize the risk for rupture of the Suture Tape over the tip of the screws.  The wounds were irrigated copiously and the retinaculum repaired with Vicryl followed by Vicryl for the subcutaneous tissue with Steri-Strips and sterile gauze for the skin. The wounds were also injected. A long leg splint was applied. The patient was awakened and returned to the PACU in stable and satisfactory condition. There were no complications.

## 2019-03-10 ENCOUNTER — Encounter (HOSPITAL_BASED_OUTPATIENT_CLINIC_OR_DEPARTMENT_OTHER): Payer: Self-pay | Admitting: Orthopedic Surgery

## 2019-03-10 NOTE — Anesthesia Postprocedure Evaluation (Signed)
Anesthesia Post Note  Patient: Nhyla Nappi Mangino  Procedure(s) Performed: OPEN REDUCTION INTERNAL (ORIF) FIXATION PATELLA (Right Knee)     Patient location during evaluation: PACU Anesthesia Type: General Level of consciousness: awake and alert Pain management: pain level controlled Vital Signs Assessment: post-procedure vital signs reviewed and stable Respiratory status: spontaneous breathing, nonlabored ventilation, respiratory function stable and patient connected to nasal cannula oxygen Cardiovascular status: blood pressure returned to baseline and stable Postop Assessment: no apparent nausea or vomiting Anesthetic complications: no    Last Vitals:  Vitals:   03/07/19 1513 03/07/19 1540  BP:  (!) 148/61  Pulse: 67 74  Resp: 12 18  Temp:  37 C  SpO2: 97% 100%    Last Pain:  Vitals:   03/10/19 0924  TempSrc:   PainSc: 4                  Tiajuana Amass

## 2019-03-21 DIAGNOSIS — S82001D Unspecified fracture of right patella, subsequent encounter for closed fracture with routine healing: Secondary | ICD-10-CM | POA: Diagnosis not present

## 2019-03-24 DIAGNOSIS — S82001D Unspecified fracture of right patella, subsequent encounter for closed fracture with routine healing: Secondary | ICD-10-CM | POA: Diagnosis not present

## 2019-04-21 DIAGNOSIS — S82001D Unspecified fracture of right patella, subsequent encounter for closed fracture with routine healing: Secondary | ICD-10-CM | POA: Diagnosis not present

## 2019-04-24 DIAGNOSIS — M25561 Pain in right knee: Secondary | ICD-10-CM | POA: Diagnosis not present

## 2019-04-24 DIAGNOSIS — M25661 Stiffness of right knee, not elsewhere classified: Secondary | ICD-10-CM | POA: Diagnosis not present

## 2019-04-24 DIAGNOSIS — M6281 Muscle weakness (generalized): Secondary | ICD-10-CM | POA: Diagnosis not present

## 2019-04-24 DIAGNOSIS — R262 Difficulty in walking, not elsewhere classified: Secondary | ICD-10-CM | POA: Diagnosis not present

## 2019-04-29 DIAGNOSIS — R262 Difficulty in walking, not elsewhere classified: Secondary | ICD-10-CM | POA: Diagnosis not present

## 2019-04-29 DIAGNOSIS — M25661 Stiffness of right knee, not elsewhere classified: Secondary | ICD-10-CM | POA: Diagnosis not present

## 2019-04-29 DIAGNOSIS — M6281 Muscle weakness (generalized): Secondary | ICD-10-CM | POA: Diagnosis not present

## 2019-04-29 DIAGNOSIS — M25561 Pain in right knee: Secondary | ICD-10-CM | POA: Diagnosis not present

## 2019-05-02 DIAGNOSIS — M6281 Muscle weakness (generalized): Secondary | ICD-10-CM | POA: Diagnosis not present

## 2019-05-02 DIAGNOSIS — M25661 Stiffness of right knee, not elsewhere classified: Secondary | ICD-10-CM | POA: Diagnosis not present

## 2019-05-02 DIAGNOSIS — R262 Difficulty in walking, not elsewhere classified: Secondary | ICD-10-CM | POA: Diagnosis not present

## 2019-05-02 DIAGNOSIS — M25561 Pain in right knee: Secondary | ICD-10-CM | POA: Diagnosis not present

## 2019-05-05 DIAGNOSIS — M6281 Muscle weakness (generalized): Secondary | ICD-10-CM | POA: Diagnosis not present

## 2019-05-05 DIAGNOSIS — R262 Difficulty in walking, not elsewhere classified: Secondary | ICD-10-CM | POA: Diagnosis not present

## 2019-05-05 DIAGNOSIS — M25661 Stiffness of right knee, not elsewhere classified: Secondary | ICD-10-CM | POA: Diagnosis not present

## 2019-05-05 DIAGNOSIS — M25561 Pain in right knee: Secondary | ICD-10-CM | POA: Diagnosis not present

## 2019-05-08 DIAGNOSIS — M25661 Stiffness of right knee, not elsewhere classified: Secondary | ICD-10-CM | POA: Diagnosis not present

## 2019-05-08 DIAGNOSIS — M6281 Muscle weakness (generalized): Secondary | ICD-10-CM | POA: Diagnosis not present

## 2019-05-08 DIAGNOSIS — R262 Difficulty in walking, not elsewhere classified: Secondary | ICD-10-CM | POA: Diagnosis not present

## 2019-05-08 DIAGNOSIS — M25561 Pain in right knee: Secondary | ICD-10-CM | POA: Diagnosis not present

## 2019-05-12 DIAGNOSIS — M6281 Muscle weakness (generalized): Secondary | ICD-10-CM | POA: Diagnosis not present

## 2019-05-12 DIAGNOSIS — R262 Difficulty in walking, not elsewhere classified: Secondary | ICD-10-CM | POA: Diagnosis not present

## 2019-05-12 DIAGNOSIS — M25561 Pain in right knee: Secondary | ICD-10-CM | POA: Diagnosis not present

## 2019-05-12 DIAGNOSIS — M25661 Stiffness of right knee, not elsewhere classified: Secondary | ICD-10-CM | POA: Diagnosis not present

## 2019-05-15 DIAGNOSIS — M25561 Pain in right knee: Secondary | ICD-10-CM | POA: Diagnosis not present

## 2019-05-15 DIAGNOSIS — M6281 Muscle weakness (generalized): Secondary | ICD-10-CM | POA: Diagnosis not present

## 2019-05-15 DIAGNOSIS — M25661 Stiffness of right knee, not elsewhere classified: Secondary | ICD-10-CM | POA: Diagnosis not present

## 2019-05-15 DIAGNOSIS — R262 Difficulty in walking, not elsewhere classified: Secondary | ICD-10-CM | POA: Diagnosis not present

## 2019-05-20 DIAGNOSIS — M25661 Stiffness of right knee, not elsewhere classified: Secondary | ICD-10-CM | POA: Diagnosis not present

## 2019-05-20 DIAGNOSIS — R262 Difficulty in walking, not elsewhere classified: Secondary | ICD-10-CM | POA: Diagnosis not present

## 2019-05-20 DIAGNOSIS — M6281 Muscle weakness (generalized): Secondary | ICD-10-CM | POA: Diagnosis not present

## 2019-05-20 DIAGNOSIS — M25561 Pain in right knee: Secondary | ICD-10-CM | POA: Diagnosis not present

## 2019-05-21 DIAGNOSIS — S82001A Unspecified fracture of right patella, initial encounter for closed fracture: Secondary | ICD-10-CM | POA: Diagnosis not present

## 2019-05-23 DIAGNOSIS — R262 Difficulty in walking, not elsewhere classified: Secondary | ICD-10-CM | POA: Diagnosis not present

## 2019-05-23 DIAGNOSIS — M25561 Pain in right knee: Secondary | ICD-10-CM | POA: Diagnosis not present

## 2019-05-23 DIAGNOSIS — M6281 Muscle weakness (generalized): Secondary | ICD-10-CM | POA: Diagnosis not present

## 2019-05-23 DIAGNOSIS — M25661 Stiffness of right knee, not elsewhere classified: Secondary | ICD-10-CM | POA: Diagnosis not present

## 2019-05-27 DIAGNOSIS — M25561 Pain in right knee: Secondary | ICD-10-CM | POA: Diagnosis not present

## 2019-05-27 DIAGNOSIS — M25661 Stiffness of right knee, not elsewhere classified: Secondary | ICD-10-CM | POA: Diagnosis not present

## 2019-05-27 DIAGNOSIS — R262 Difficulty in walking, not elsewhere classified: Secondary | ICD-10-CM | POA: Diagnosis not present

## 2019-05-27 DIAGNOSIS — M6281 Muscle weakness (generalized): Secondary | ICD-10-CM | POA: Diagnosis not present

## 2019-05-29 DIAGNOSIS — R262 Difficulty in walking, not elsewhere classified: Secondary | ICD-10-CM | POA: Diagnosis not present

## 2019-05-29 DIAGNOSIS — M6281 Muscle weakness (generalized): Secondary | ICD-10-CM | POA: Diagnosis not present

## 2019-05-29 DIAGNOSIS — M25561 Pain in right knee: Secondary | ICD-10-CM | POA: Diagnosis not present

## 2019-05-29 DIAGNOSIS — M25661 Stiffness of right knee, not elsewhere classified: Secondary | ICD-10-CM | POA: Diagnosis not present

## 2019-06-03 DIAGNOSIS — M6281 Muscle weakness (generalized): Secondary | ICD-10-CM | POA: Diagnosis not present

## 2019-06-03 DIAGNOSIS — R262 Difficulty in walking, not elsewhere classified: Secondary | ICD-10-CM | POA: Diagnosis not present

## 2019-06-03 DIAGNOSIS — M25561 Pain in right knee: Secondary | ICD-10-CM | POA: Diagnosis not present

## 2019-06-03 DIAGNOSIS — M25661 Stiffness of right knee, not elsewhere classified: Secondary | ICD-10-CM | POA: Diagnosis not present

## 2019-06-05 DIAGNOSIS — M6281 Muscle weakness (generalized): Secondary | ICD-10-CM | POA: Diagnosis not present

## 2019-06-05 DIAGNOSIS — M25661 Stiffness of right knee, not elsewhere classified: Secondary | ICD-10-CM | POA: Diagnosis not present

## 2019-06-05 DIAGNOSIS — M25561 Pain in right knee: Secondary | ICD-10-CM | POA: Diagnosis not present

## 2019-06-05 DIAGNOSIS — R262 Difficulty in walking, not elsewhere classified: Secondary | ICD-10-CM | POA: Diagnosis not present

## 2019-06-10 DIAGNOSIS — R262 Difficulty in walking, not elsewhere classified: Secondary | ICD-10-CM | POA: Diagnosis not present

## 2019-06-10 DIAGNOSIS — M25661 Stiffness of right knee, not elsewhere classified: Secondary | ICD-10-CM | POA: Diagnosis not present

## 2019-06-10 DIAGNOSIS — M25561 Pain in right knee: Secondary | ICD-10-CM | POA: Diagnosis not present

## 2019-06-10 DIAGNOSIS — M6281 Muscle weakness (generalized): Secondary | ICD-10-CM | POA: Diagnosis not present

## 2019-06-12 DIAGNOSIS — M6281 Muscle weakness (generalized): Secondary | ICD-10-CM | POA: Diagnosis not present

## 2019-06-12 DIAGNOSIS — M25661 Stiffness of right knee, not elsewhere classified: Secondary | ICD-10-CM | POA: Diagnosis not present

## 2019-06-12 DIAGNOSIS — R262 Difficulty in walking, not elsewhere classified: Secondary | ICD-10-CM | POA: Diagnosis not present

## 2019-06-12 DIAGNOSIS — M25561 Pain in right knee: Secondary | ICD-10-CM | POA: Diagnosis not present

## 2019-06-17 DIAGNOSIS — M25661 Stiffness of right knee, not elsewhere classified: Secondary | ICD-10-CM | POA: Diagnosis not present

## 2019-06-17 DIAGNOSIS — M6281 Muscle weakness (generalized): Secondary | ICD-10-CM | POA: Diagnosis not present

## 2019-06-17 DIAGNOSIS — M25561 Pain in right knee: Secondary | ICD-10-CM | POA: Diagnosis not present

## 2019-06-17 DIAGNOSIS — R262 Difficulty in walking, not elsewhere classified: Secondary | ICD-10-CM | POA: Diagnosis not present

## 2019-06-19 DIAGNOSIS — M6281 Muscle weakness (generalized): Secondary | ICD-10-CM | POA: Diagnosis not present

## 2019-06-19 DIAGNOSIS — M25561 Pain in right knee: Secondary | ICD-10-CM | POA: Diagnosis not present

## 2019-06-19 DIAGNOSIS — M25661 Stiffness of right knee, not elsewhere classified: Secondary | ICD-10-CM | POA: Diagnosis not present

## 2019-06-19 DIAGNOSIS — R262 Difficulty in walking, not elsewhere classified: Secondary | ICD-10-CM | POA: Diagnosis not present

## 2019-06-24 DIAGNOSIS — R262 Difficulty in walking, not elsewhere classified: Secondary | ICD-10-CM | POA: Diagnosis not present

## 2019-06-24 DIAGNOSIS — M25661 Stiffness of right knee, not elsewhere classified: Secondary | ICD-10-CM | POA: Diagnosis not present

## 2019-06-24 DIAGNOSIS — M6281 Muscle weakness (generalized): Secondary | ICD-10-CM | POA: Diagnosis not present

## 2019-06-24 DIAGNOSIS — M25561 Pain in right knee: Secondary | ICD-10-CM | POA: Diagnosis not present

## 2019-06-26 DIAGNOSIS — M25561 Pain in right knee: Secondary | ICD-10-CM | POA: Diagnosis not present

## 2019-06-26 DIAGNOSIS — R262 Difficulty in walking, not elsewhere classified: Secondary | ICD-10-CM | POA: Diagnosis not present

## 2019-06-26 DIAGNOSIS — M25661 Stiffness of right knee, not elsewhere classified: Secondary | ICD-10-CM | POA: Diagnosis not present

## 2019-06-26 DIAGNOSIS — M6281 Muscle weakness (generalized): Secondary | ICD-10-CM | POA: Diagnosis not present

## 2019-07-01 DIAGNOSIS — M25561 Pain in right knee: Secondary | ICD-10-CM | POA: Diagnosis not present

## 2019-07-01 DIAGNOSIS — M25661 Stiffness of right knee, not elsewhere classified: Secondary | ICD-10-CM | POA: Diagnosis not present

## 2019-07-01 DIAGNOSIS — M6281 Muscle weakness (generalized): Secondary | ICD-10-CM | POA: Diagnosis not present

## 2019-07-01 DIAGNOSIS — R262 Difficulty in walking, not elsewhere classified: Secondary | ICD-10-CM | POA: Diagnosis not present

## 2019-07-02 DIAGNOSIS — S82001D Unspecified fracture of right patella, subsequent encounter for closed fracture with routine healing: Secondary | ICD-10-CM | POA: Diagnosis not present

## 2019-07-03 DIAGNOSIS — M25561 Pain in right knee: Secondary | ICD-10-CM | POA: Diagnosis not present

## 2019-07-03 DIAGNOSIS — R262 Difficulty in walking, not elsewhere classified: Secondary | ICD-10-CM | POA: Diagnosis not present

## 2019-07-03 DIAGNOSIS — M25661 Stiffness of right knee, not elsewhere classified: Secondary | ICD-10-CM | POA: Diagnosis not present

## 2019-07-03 DIAGNOSIS — M6281 Muscle weakness (generalized): Secondary | ICD-10-CM | POA: Diagnosis not present

## 2019-07-08 DIAGNOSIS — M25661 Stiffness of right knee, not elsewhere classified: Secondary | ICD-10-CM | POA: Diagnosis not present

## 2019-07-08 DIAGNOSIS — M6281 Muscle weakness (generalized): Secondary | ICD-10-CM | POA: Diagnosis not present

## 2019-07-08 DIAGNOSIS — R262 Difficulty in walking, not elsewhere classified: Secondary | ICD-10-CM | POA: Diagnosis not present

## 2019-07-08 DIAGNOSIS — M25561 Pain in right knee: Secondary | ICD-10-CM | POA: Diagnosis not present

## 2019-07-10 DIAGNOSIS — R262 Difficulty in walking, not elsewhere classified: Secondary | ICD-10-CM | POA: Diagnosis not present

## 2019-07-10 DIAGNOSIS — M6281 Muscle weakness (generalized): Secondary | ICD-10-CM | POA: Diagnosis not present

## 2019-07-10 DIAGNOSIS — M25561 Pain in right knee: Secondary | ICD-10-CM | POA: Diagnosis not present

## 2019-07-10 DIAGNOSIS — M25661 Stiffness of right knee, not elsewhere classified: Secondary | ICD-10-CM | POA: Diagnosis not present

## 2019-07-15 DIAGNOSIS — M25561 Pain in right knee: Secondary | ICD-10-CM | POA: Diagnosis not present

## 2019-07-15 DIAGNOSIS — M25661 Stiffness of right knee, not elsewhere classified: Secondary | ICD-10-CM | POA: Diagnosis not present

## 2019-07-15 DIAGNOSIS — M6281 Muscle weakness (generalized): Secondary | ICD-10-CM | POA: Diagnosis not present

## 2019-07-15 DIAGNOSIS — R262 Difficulty in walking, not elsewhere classified: Secondary | ICD-10-CM | POA: Diagnosis not present

## 2019-07-17 DIAGNOSIS — R262 Difficulty in walking, not elsewhere classified: Secondary | ICD-10-CM | POA: Diagnosis not present

## 2019-07-17 DIAGNOSIS — M6281 Muscle weakness (generalized): Secondary | ICD-10-CM | POA: Diagnosis not present

## 2019-07-17 DIAGNOSIS — M25661 Stiffness of right knee, not elsewhere classified: Secondary | ICD-10-CM | POA: Diagnosis not present

## 2019-07-17 DIAGNOSIS — M25561 Pain in right knee: Secondary | ICD-10-CM | POA: Diagnosis not present

## 2019-07-22 DIAGNOSIS — M6281 Muscle weakness (generalized): Secondary | ICD-10-CM | POA: Diagnosis not present

## 2019-07-22 DIAGNOSIS — M25661 Stiffness of right knee, not elsewhere classified: Secondary | ICD-10-CM | POA: Diagnosis not present

## 2019-07-22 DIAGNOSIS — R262 Difficulty in walking, not elsewhere classified: Secondary | ICD-10-CM | POA: Diagnosis not present

## 2019-07-22 DIAGNOSIS — M25561 Pain in right knee: Secondary | ICD-10-CM | POA: Diagnosis not present

## 2019-07-29 DIAGNOSIS — M6281 Muscle weakness (generalized): Secondary | ICD-10-CM | POA: Diagnosis not present

## 2019-07-29 DIAGNOSIS — M25561 Pain in right knee: Secondary | ICD-10-CM | POA: Diagnosis not present

## 2019-07-29 DIAGNOSIS — M25661 Stiffness of right knee, not elsewhere classified: Secondary | ICD-10-CM | POA: Diagnosis not present

## 2019-07-29 DIAGNOSIS — R262 Difficulty in walking, not elsewhere classified: Secondary | ICD-10-CM | POA: Diagnosis not present

## 2019-08-13 DIAGNOSIS — M25561 Pain in right knee: Secondary | ICD-10-CM | POA: Diagnosis not present

## 2019-09-12 ENCOUNTER — Encounter (HOSPITAL_COMMUNITY): Payer: Self-pay | Admitting: Emergency Medicine

## 2019-09-12 ENCOUNTER — Emergency Department (HOSPITAL_COMMUNITY)
Admission: EM | Admit: 2019-09-12 | Discharge: 2019-09-13 | Disposition: A | Discharge status: Home / Self Care | Payer: BLUE CROSS/BLUE SHIELD | Attending: Emergency Medicine | Admitting: Emergency Medicine

## 2019-09-12 ENCOUNTER — Emergency Department (HOSPITAL_COMMUNITY): Payer: BLUE CROSS/BLUE SHIELD

## 2019-09-12 ENCOUNTER — Other Ambulatory Visit: Payer: Self-pay

## 2019-09-12 DIAGNOSIS — W1830XA Fall on same level, unspecified, initial encounter: Secondary | ICD-10-CM | POA: Diagnosis not present

## 2019-09-12 DIAGNOSIS — Y999 Unspecified external cause status: Secondary | ICD-10-CM | POA: Diagnosis not present

## 2019-09-12 DIAGNOSIS — Y9222 Religious institution as the place of occurrence of the external cause: Secondary | ICD-10-CM | POA: Insufficient documentation

## 2019-09-12 DIAGNOSIS — S4992XA Unspecified injury of left shoulder and upper arm, initial encounter: Secondary | ICD-10-CM | POA: Diagnosis not present

## 2019-09-12 DIAGNOSIS — S42292A Other displaced fracture of upper end of left humerus, initial encounter for closed fracture: Secondary | ICD-10-CM | POA: Insufficient documentation

## 2019-09-12 DIAGNOSIS — Y9389 Activity, other specified: Secondary | ICD-10-CM | POA: Insufficient documentation

## 2019-09-12 DIAGNOSIS — S42212A Unspecified displaced fracture of surgical neck of left humerus, initial encounter for closed fracture: Secondary | ICD-10-CM | POA: Diagnosis not present

## 2019-09-12 NOTE — ED Triage Notes (Signed)
Patient tripped and fell forward at church this evening , no LOC/ambulatory , alert and oriented, respirations unlabored, reports left shoulder joint pain .

## 2019-09-13 MED ORDER — OXYCODONE-ACETAMINOPHEN 5-325 MG PO TABS
1.0000 | ORAL_TABLET | Freq: Once | ORAL | Status: AC
  Administered 2019-09-13: 02:00:00 1 via ORAL
  Filled 2019-09-13: qty 1

## 2019-09-13 MED ORDER — OXYCODONE-ACETAMINOPHEN 5-325 MG PO TABS: 1.0000 | ORAL_TABLET | ORAL | 0 refills | Status: DC | PRN

## 2019-09-13 NOTE — ED Notes (Signed)
Patient verbalizes understanding of discharge instructions. Opportunity for questioning and answers were provided. Armband removed by staff, pt discharged from ED ambulatory.   

## 2019-09-13 NOTE — ED Provider Notes (Signed)
[facility] EMERGENCY DEPARTMENT Provider Note   CSN: [id number] Arrival date & time: 09/12/19  2039     History Chief Complaint  Patient presents with  . Fall / Shoulder Injury    Suzanne Barton is a 69 y.o. female.  Patient to ED after mechanical fall earlier this evening after tripping. She reports landing on her left shoulder and hearing a "pop". She did not hit her head. No neck pain, chest discomfort, SOB or pain with breathing, abdominal pain. She has been ambulatory since the fall and denies hip or LE pain. No numbness or weakness of the left arm. She is not anticoagulated.   The history is provided by the patient. No language interpreter was used.       Past Medical History:  Diagnosis Date  . Allergy   . Anemia   . Blood transfusion   . Femur fracture, right ([place]) 02/10/2016  . GERD (gastroesophageal reflux disease)   . Hepatitis C 1992   in remission  . Hyperlipidemia    no meds  . Neuromuscular disorder ([place])    hiatal hernia  . Right patella fracture   . Seasonal allergies     Patient Active Problem List   Diagnosis Date Noted  . Right patella fracture 03/07/2019  . Femur fracture, right ([place]) 02/10/2016  . Chronic cough 02/13/2011    Past Surgical History:  Procedure Laterality Date  . APPENDECTOMY    . CHOLECYSTECTOMY    . cyst removed     x3- under right arm, x3- groin area- as a teenager  . FEMUR IM NAIL Right 02/10/2016   Procedure: INTRAMEDULLARY (IM) NAIL FEMORAL;  Surgeon: Marchia Bond, MD;  Location: [place];  Service: Orthopedics;  Laterality: Right;  . FRACTURE SURGERY  1973   right femur  . FRACTURE SURGERY     left thumb  . knee arthoscopy     x3  . LIVER BIOPSY     x12  . ORIF PATELLA Right 03/07/2019   Procedure: OPEN REDUCTION INTERNAL (ORIF) FIXATION PATELLA;  Surgeon: Marchia Bond, MD;  Location: [place];  Service: Orthopedics;  Laterality: Right;  . OVARIAN CYST SURGERY    . PARTIAL  HYSTERECTOMY     had partial first, then later had all female organs removed  . SPINE SURGERY  11-13-1999   vertebrae fusion  . TONSILLECTOMY AND ADENOIDECTOMY    . TUBAL LIGATION       OB History   No obstetric history on file.     Family History  Problem Relation Age of Onset  . Heart disease Father   . Heart failure Father   . Lung cancer Mother   . Cancer Mother        lung and brain  . COPD Sister   . Lung cancer Sister   . Colon cancer Neg Hx   . Esophageal cancer Neg Hx   . Stomach cancer Neg Hx   . Rectal cancer Neg Hx     Social History   Tobacco Use  . Smoking status: Never Smoker  . Smokeless tobacco: Never Used  . Tobacco comment: husband smoked x 13 yrs  Substance Use Topics  . Alcohol use: No  . Drug use: No    Home Medications Prior to Admission medications   Medication Sig Start Date End Date Taking? Authorizing Provider  beclomethasone (QVAR) 80 MCG/ACT inhaler Inhale 1 puff into the lungs 2 (two) times daily. 02/09/11   Ramaswamy,  Belva Crome, MD  HYDROcodone-acetaminophen (NORCO) 10-325 MG tablet Take 1-2 tablets by mouth every 6 (six) hours as needed. 03/07/19   Marchia Bond, MD  Pseudoeph-CPM-DM-APAP (ALKA-SELTZER PLUS COLD/COUGH) 30-2-10-250 MG CAPS Take 1 capsule by mouth as directed.      [provider]    Allergies    Tussionex pennkinetic er [hydrocod polst-cpm polst er], Amoxicillin, Ibuprofen, and Sulfa antibiotics  Review of Systems   Review of Systems  Constitutional: Negative for chills and fever.  Respiratory: Negative.  Negative for shortness of breath.   Cardiovascular: Negative.  Negative for chest pain.  Gastrointestinal: Negative.  Negative for abdominal pain.  Musculoskeletal:       Left shoulder pain  Skin: Positive for color change (left UE bruising). Negative for wound.  Neurological: Negative.  Negative for syncope, numbness and headaches.    Physical Exam Updated Vital Signs BP (!) 131/49 (BP Location: Right  Arm)   Pulse 93   Temp 97.8 F (36.6 C) (Oral)   Resp 16   SpO2 100%   Physical Exam Vitals and nursing note reviewed.  Constitutional:      General: She is not in acute distress.    Appearance: She is well-developed.  HENT:     Head: Atraumatic.  Pulmonary:     Effort: Pulmonary effort is normal.  Chest:     Chest wall: No tenderness.  Abdominal:     Tenderness: There is no abdominal tenderness.  Musculoskeletal:     Cervical back: Normal range of motion.     Comments: Ecchymosis to proximal left UE with moderate swelling. No deformity. Distal pulses present. Grip strength on left full. No wrist or elbow tenderness. No midline cervical tenderness.   Skin:    General: Skin is warm and dry.     Findings: Bruising present.  Neurological:     Mental Status: She is alert and oriented to person, place, and time.     Sensory: No sensory deficit.     ED Results / Procedures / Treatments   Labs (all labs ordered are listed, but only abnormal results are displayed) Labs Reviewed - No data to display  EKG None  Radiology DG Shoulder Left  Result Date: 09/12/2019 CLINICAL DATA:  Pain EXAM: LEFT SHOULDER - 2+ VIEW COMPARISON:  None. FINDINGS: There is an acute impacted mildly displaced fracture involving the surgical neck. There is a displaced fracture fragment measuring approximately 2.4 cm likely involving the greater tuberosity. There are advanced degenerative changes of the left glenohumeral joint. There is no glenohumeral dislocation. IMPRESSION: 1. Acute mildly displaced fracture of the surgical neck with a displaced fracture fragment likely involving the greater tuberosity. 2. 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 Electronically Signed   By: Constance Holster M.D.   On: 09/12/2019 21:35    Procedures Procedures (including critical care time)  Medications Ordered in ED Medications  oxyCODONE-acetaminophen (PERCOCET/ROXICET) 5-325 MG per tablet 1  tablet (has no administration in time range)    ED Course  I have reviewed the triage vital signs and the nursing notes.  Pertinent labs & imaging results that were available during my care of the patient were reviewed by me and considered in my medical decision making (see chart for details).    MDM Rules/Calculators/A&P                      Patient to ED with left shoulder injury after mechanical fall.   Imaging show a humeral  head fracture without dislocation. No neurovascular compromise. Do not feel surgical intervention will be required. Will provide pain management, shoulder immobilizer. She is an established patient with dr. Mardelle Matte and will follow up outpatient with him this week.   Final Clinical Impression(s) / ED Diagnoses Final diagnoses:  None   1. Left humeral head fracture  Rx / DC Orders ED Discharge Orders    None       Charlann Lange, PA-C 09/13/19 0226    Orpah Greek, MD 09/13/19 [phone]

## 2019-09-13 NOTE — Discharge Instructions (Addendum)
Please contact Dr. Luanna Cole office on Monday to schedule an appointment for further management of humeral fracture. Percocet for pain as needed. Return to the emergency department with any new concerns.

## 2019-09-13 NOTE — Progress Notes (Signed)
Orthopedic Tech Progress Note Patient Details:  Suzanne Barton 08-03-1951 [id number]  Ortho Devices Type of Ortho Device: Sling immobilizer Ortho Device/Splint Location: lue Ortho Device/Splint Interventions: Ordered, Application, Adjustment   Post Interventions Patient Tolerated: Well Instructions Provided: Care of device, Adjustment of device   Karolee Stamps 09/13/2019, 4:49 AM

## 2019-09-15 ENCOUNTER — Other Ambulatory Visit: Payer: Self-pay | Admitting: Orthopedic Surgery

## 2019-09-15 DIAGNOSIS — M25512 Pain in left shoulder: Secondary | ICD-10-CM

## 2019-09-15 DIAGNOSIS — S4292XA Fracture of left shoulder girdle, part unspecified, initial encounter for closed fracture: Secondary | ICD-10-CM | POA: Diagnosis not present

## 2019-09-16 ENCOUNTER — Ambulatory Visit
Admission: RE | Admit: 2019-09-16 | Discharge: 2019-09-16 | Disposition: A | Discharge status: Home / Self Care | Payer: BLUE CROSS/BLUE SHIELD | Source: Ambulatory Visit | Attending: Orthopedic Surgery | Admitting: Orthopedic Surgery

## 2019-09-16 DIAGNOSIS — S42292A Other displaced fracture of upper end of left humerus, initial encounter for closed fracture: Secondary | ICD-10-CM | POA: Diagnosis not present

## 2019-09-16 DIAGNOSIS — M25512 Pain in left shoulder: Secondary | ICD-10-CM

## 2019-09-19 DIAGNOSIS — S4292XD Fracture of left shoulder girdle, part unspecified, subsequent encounter for fracture with routine healing: Secondary | ICD-10-CM | POA: Diagnosis not present

## 2019-09-23 ENCOUNTER — Other Ambulatory Visit: Payer: BLUE CROSS/BLUE SHIELD

## 2019-09-26 DIAGNOSIS — S4292XD Fracture of left shoulder girdle, part unspecified, subsequent encounter for fracture with routine healing: Secondary | ICD-10-CM | POA: Diagnosis not present

## 2019-10-27 DIAGNOSIS — S4292XD Fracture of left shoulder girdle, part unspecified, subsequent encounter for fracture with routine healing: Secondary | ICD-10-CM | POA: Diagnosis not present

## 2019-11-03 DIAGNOSIS — M25512 Pain in left shoulder: Secondary | ICD-10-CM | POA: Diagnosis not present

## 2019-11-03 DIAGNOSIS — M6281 Muscle weakness (generalized): Secondary | ICD-10-CM | POA: Diagnosis not present

## 2019-11-03 DIAGNOSIS — M25612 Stiffness of left shoulder, not elsewhere classified: Secondary | ICD-10-CM | POA: Diagnosis not present

## 2019-11-03 DIAGNOSIS — S4292XD Fracture of left shoulder girdle, part unspecified, subsequent encounter for fracture with routine healing: Secondary | ICD-10-CM | POA: Diagnosis not present

## 2019-11-07 DIAGNOSIS — S4292XD Fracture of left shoulder girdle, part unspecified, subsequent encounter for fracture with routine healing: Secondary | ICD-10-CM | POA: Diagnosis not present

## 2019-11-07 DIAGNOSIS — M25612 Stiffness of left shoulder, not elsewhere classified: Secondary | ICD-10-CM | POA: Diagnosis not present

## 2019-11-07 DIAGNOSIS — M6281 Muscle weakness (generalized): Secondary | ICD-10-CM | POA: Diagnosis not present

## 2019-11-07 DIAGNOSIS — M25512 Pain in left shoulder: Secondary | ICD-10-CM | POA: Diagnosis not present

## 2019-11-10 DIAGNOSIS — S4292XD Fracture of left shoulder girdle, part unspecified, subsequent encounter for fracture with routine healing: Secondary | ICD-10-CM | POA: Diagnosis not present

## 2019-11-10 DIAGNOSIS — M25612 Stiffness of left shoulder, not elsewhere classified: Secondary | ICD-10-CM | POA: Diagnosis not present

## 2019-11-10 DIAGNOSIS — M25512 Pain in left shoulder: Secondary | ICD-10-CM | POA: Diagnosis not present

## 2019-11-10 DIAGNOSIS — M6281 Muscle weakness (generalized): Secondary | ICD-10-CM | POA: Diagnosis not present

## 2019-11-14 DIAGNOSIS — M25612 Stiffness of left shoulder, not elsewhere classified: Secondary | ICD-10-CM | POA: Diagnosis not present

## 2019-11-14 DIAGNOSIS — M25512 Pain in left shoulder: Secondary | ICD-10-CM | POA: Diagnosis not present

## 2019-11-14 DIAGNOSIS — S4292XD Fracture of left shoulder girdle, part unspecified, subsequent encounter for fracture with routine healing: Secondary | ICD-10-CM | POA: Diagnosis not present

## 2019-11-14 DIAGNOSIS — M6281 Muscle weakness (generalized): Secondary | ICD-10-CM | POA: Diagnosis not present

## 2019-11-17 DIAGNOSIS — M25512 Pain in left shoulder: Secondary | ICD-10-CM | POA: Diagnosis not present

## 2019-11-17 DIAGNOSIS — M6281 Muscle weakness (generalized): Secondary | ICD-10-CM | POA: Diagnosis not present

## 2019-11-17 DIAGNOSIS — S4292XD Fracture of left shoulder girdle, part unspecified, subsequent encounter for fracture with routine healing: Secondary | ICD-10-CM | POA: Diagnosis not present

## 2019-11-17 DIAGNOSIS — M25612 Stiffness of left shoulder, not elsewhere classified: Secondary | ICD-10-CM | POA: Diagnosis not present

## 2019-11-21 DIAGNOSIS — M25512 Pain in left shoulder: Secondary | ICD-10-CM | POA: Diagnosis not present

## 2019-11-21 DIAGNOSIS — M6281 Muscle weakness (generalized): Secondary | ICD-10-CM | POA: Diagnosis not present

## 2019-11-21 DIAGNOSIS — M25612 Stiffness of left shoulder, not elsewhere classified: Secondary | ICD-10-CM | POA: Diagnosis not present

## 2019-11-21 DIAGNOSIS — S4292XD Fracture of left shoulder girdle, part unspecified, subsequent encounter for fracture with routine healing: Secondary | ICD-10-CM | POA: Diagnosis not present

## 2019-11-24 DIAGNOSIS — S4292XD Fracture of left shoulder girdle, part unspecified, subsequent encounter for fracture with routine healing: Secondary | ICD-10-CM | POA: Diagnosis not present

## 2019-11-24 DIAGNOSIS — M25612 Stiffness of left shoulder, not elsewhere classified: Secondary | ICD-10-CM | POA: Diagnosis not present

## 2019-11-24 DIAGNOSIS — M6281 Muscle weakness (generalized): Secondary | ICD-10-CM | POA: Diagnosis not present

## 2019-11-24 DIAGNOSIS — S42295A Other nondisplaced fracture of upper end of left humerus, initial encounter for closed fracture: Secondary | ICD-10-CM | POA: Diagnosis not present

## 2019-11-24 DIAGNOSIS — M25512 Pain in left shoulder: Secondary | ICD-10-CM | POA: Diagnosis not present

## 2019-11-28 DIAGNOSIS — S4292XD Fracture of left shoulder girdle, part unspecified, subsequent encounter for fracture with routine healing: Secondary | ICD-10-CM | POA: Diagnosis not present

## 2019-11-28 DIAGNOSIS — M25612 Stiffness of left shoulder, not elsewhere classified: Secondary | ICD-10-CM | POA: Diagnosis not present

## 2019-11-28 DIAGNOSIS — M6281 Muscle weakness (generalized): Secondary | ICD-10-CM | POA: Diagnosis not present

## 2019-11-28 DIAGNOSIS — M25512 Pain in left shoulder: Secondary | ICD-10-CM | POA: Diagnosis not present

## 2019-12-02 DIAGNOSIS — M6281 Muscle weakness (generalized): Secondary | ICD-10-CM | POA: Diagnosis not present

## 2019-12-02 DIAGNOSIS — S4292XD Fracture of left shoulder girdle, part unspecified, subsequent encounter for fracture with routine healing: Secondary | ICD-10-CM | POA: Diagnosis not present

## 2019-12-02 DIAGNOSIS — M25612 Stiffness of left shoulder, not elsewhere classified: Secondary | ICD-10-CM | POA: Diagnosis not present

## 2019-12-02 DIAGNOSIS — M25512 Pain in left shoulder: Secondary | ICD-10-CM | POA: Diagnosis not present

## 2019-12-05 DIAGNOSIS — M25612 Stiffness of left shoulder, not elsewhere classified: Secondary | ICD-10-CM | POA: Diagnosis not present

## 2019-12-05 DIAGNOSIS — M25512 Pain in left shoulder: Secondary | ICD-10-CM | POA: Diagnosis not present

## 2019-12-05 DIAGNOSIS — M6281 Muscle weakness (generalized): Secondary | ICD-10-CM | POA: Diagnosis not present

## 2019-12-05 DIAGNOSIS — S4292XD Fracture of left shoulder girdle, part unspecified, subsequent encounter for fracture with routine healing: Secondary | ICD-10-CM | POA: Diagnosis not present

## 2019-12-15 DIAGNOSIS — S4292XD Fracture of left shoulder girdle, part unspecified, subsequent encounter for fracture with routine healing: Secondary | ICD-10-CM | POA: Diagnosis not present

## 2019-12-15 DIAGNOSIS — M6281 Muscle weakness (generalized): Secondary | ICD-10-CM | POA: Diagnosis not present

## 2019-12-15 DIAGNOSIS — M25612 Stiffness of left shoulder, not elsewhere classified: Secondary | ICD-10-CM | POA: Diagnosis not present

## 2019-12-19 DIAGNOSIS — M6281 Muscle weakness (generalized): Secondary | ICD-10-CM | POA: Diagnosis not present

## 2019-12-19 DIAGNOSIS — M25612 Stiffness of left shoulder, not elsewhere classified: Secondary | ICD-10-CM | POA: Diagnosis not present

## 2019-12-19 DIAGNOSIS — S4292XD Fracture of left shoulder girdle, part unspecified, subsequent encounter for fracture with routine healing: Secondary | ICD-10-CM | POA: Diagnosis not present

## 2019-12-19 DIAGNOSIS — M25512 Pain in left shoulder: Secondary | ICD-10-CM | POA: Diagnosis not present

## 2019-12-22 DIAGNOSIS — S4292XD Fracture of left shoulder girdle, part unspecified, subsequent encounter for fracture with routine healing: Secondary | ICD-10-CM | POA: Diagnosis not present

## 2019-12-22 DIAGNOSIS — M6281 Muscle weakness (generalized): Secondary | ICD-10-CM | POA: Diagnosis not present

## 2019-12-22 DIAGNOSIS — M25612 Stiffness of left shoulder, not elsewhere classified: Secondary | ICD-10-CM | POA: Diagnosis not present

## 2019-12-22 DIAGNOSIS — M25512 Pain in left shoulder: Secondary | ICD-10-CM | POA: Diagnosis not present

## 2019-12-24 ENCOUNTER — Other Ambulatory Visit: Payer: Self-pay | Admitting: Orthopedic Surgery

## 2019-12-26 DIAGNOSIS — S4292XD Fracture of left shoulder girdle, part unspecified, subsequent encounter for fracture with routine healing: Secondary | ICD-10-CM | POA: Diagnosis not present

## 2019-12-26 DIAGNOSIS — M25612 Stiffness of left shoulder, not elsewhere classified: Secondary | ICD-10-CM | POA: Diagnosis not present

## 2019-12-26 DIAGNOSIS — M6281 Muscle weakness (generalized): Secondary | ICD-10-CM | POA: Diagnosis not present

## 2019-12-29 DIAGNOSIS — M25512 Pain in left shoulder: Secondary | ICD-10-CM | POA: Diagnosis not present

## 2019-12-29 DIAGNOSIS — M6281 Muscle weakness (generalized): Secondary | ICD-10-CM | POA: Diagnosis not present

## 2019-12-29 DIAGNOSIS — M25612 Stiffness of left shoulder, not elsewhere classified: Secondary | ICD-10-CM | POA: Diagnosis not present

## 2019-12-29 DIAGNOSIS — S4292XD Fracture of left shoulder girdle, part unspecified, subsequent encounter for fracture with routine healing: Secondary | ICD-10-CM | POA: Diagnosis not present

## 2020-01-01 ENCOUNTER — Other Ambulatory Visit: Payer: Self-pay | Admitting: Obstetrics and Gynecology

## 2020-01-01 DIAGNOSIS — Z13820 Encounter for screening for osteoporosis: Secondary | ICD-10-CM

## 2020-01-02 DIAGNOSIS — M6281 Muscle weakness (generalized): Secondary | ICD-10-CM | POA: Diagnosis not present

## 2020-01-02 DIAGNOSIS — M25612 Stiffness of left shoulder, not elsewhere classified: Secondary | ICD-10-CM | POA: Diagnosis not present

## 2020-01-02 DIAGNOSIS — S4292XD Fracture of left shoulder girdle, part unspecified, subsequent encounter for fracture with routine healing: Secondary | ICD-10-CM | POA: Diagnosis not present

## 2020-01-05 DIAGNOSIS — S4292XD Fracture of left shoulder girdle, part unspecified, subsequent encounter for fracture with routine healing: Secondary | ICD-10-CM | POA: Diagnosis not present

## 2020-01-08 ENCOUNTER — Other Ambulatory Visit: Payer: Self-pay | Admitting: Obstetrics and Gynecology

## 2020-01-08 DIAGNOSIS — R87619 Unspecified abnormal cytological findings in specimens from cervix uteri: Secondary | ICD-10-CM

## 2020-01-08 DIAGNOSIS — R319 Hematuria, unspecified: Secondary | ICD-10-CM

## 2020-01-16 ENCOUNTER — Ambulatory Visit
Admission: RE | Admit: 2020-01-16 | Discharge: 2020-01-16 | Disposition: A | Discharge status: Home / Self Care | Payer: BC Managed Care – PPO | Source: Ambulatory Visit | Attending: Obstetrics and Gynecology | Admitting: Obstetrics and Gynecology

## 2020-01-16 DIAGNOSIS — R87619 Unspecified abnormal cytological findings in specimens from cervix uteri: Secondary | ICD-10-CM

## 2020-01-16 DIAGNOSIS — R319 Hematuria, unspecified: Secondary | ICD-10-CM

## 2020-02-16 DIAGNOSIS — Z1231 Encounter for screening mammogram for malignant neoplasm of breast: Secondary | ICD-10-CM | POA: Diagnosis not present

## 2020-03-17 ENCOUNTER — Ambulatory Visit
Admission: RE | Admit: 2020-03-17 | Discharge: 2020-03-17 | Disposition: A | Discharge status: Home / Self Care | Payer: BC Managed Care – PPO | Source: Ambulatory Visit | Attending: Obstetrics and Gynecology | Admitting: Obstetrics and Gynecology

## 2020-03-17 ENCOUNTER — Other Ambulatory Visit: Payer: Self-pay

## 2020-03-17 DIAGNOSIS — Z13820 Encounter for screening for osteoporosis: Secondary | ICD-10-CM

## 2020-03-17 DIAGNOSIS — Z780 Asymptomatic menopausal state: Secondary | ICD-10-CM | POA: Diagnosis not present

## 2020-03-17 DIAGNOSIS — M8588 Other specified disorders of bone density and structure, other site: Secondary | ICD-10-CM | POA: Diagnosis not present

## 2020-05-21 DIAGNOSIS — Z23 Encounter for immunization: Secondary | ICD-10-CM | POA: Diagnosis not present

## 2020-10-18 DIAGNOSIS — M545 Low back pain, unspecified: Secondary | ICD-10-CM | POA: Diagnosis not present

## 2020-10-18 DIAGNOSIS — M1711 Unilateral primary osteoarthritis, right knee: Secondary | ICD-10-CM | POA: Diagnosis not present

## 2020-10-18 DIAGNOSIS — M160 Bilateral primary osteoarthritis of hip: Secondary | ICD-10-CM | POA: Diagnosis not present

## 2020-11-29 DIAGNOSIS — M545 Low back pain, unspecified: Secondary | ICD-10-CM | POA: Diagnosis not present

## 2020-11-29 DIAGNOSIS — M160 Bilateral primary osteoarthritis of hip: Secondary | ICD-10-CM | POA: Diagnosis not present

## 2020-11-29 DIAGNOSIS — M1711 Unilateral primary osteoarthritis, right knee: Secondary | ICD-10-CM | POA: Diagnosis not present

## 2021-01-14 DIAGNOSIS — H2513 Age-related nuclear cataract, bilateral: Secondary | ICD-10-CM | POA: Diagnosis not present

## 2021-01-14 DIAGNOSIS — R030 Elevated blood-pressure reading, without diagnosis of hypertension: Secondary | ICD-10-CM | POA: Diagnosis not present

## 2021-01-14 DIAGNOSIS — D3141 Benign neoplasm of right ciliary body: Secondary | ICD-10-CM | POA: Diagnosis not present

## 2021-01-14 DIAGNOSIS — H0100A Unspecified blepharitis right eye, upper and lower eyelids: Secondary | ICD-10-CM | POA: Diagnosis not present

## 2021-02-02 ENCOUNTER — Ambulatory Visit: Payer: BC Managed Care – PPO | Admitting: Cardiology

## 2021-02-02 ENCOUNTER — Other Ambulatory Visit: Payer: Self-pay

## 2021-02-02 ENCOUNTER — Encounter: Payer: Self-pay | Admitting: Cardiology

## 2021-02-02 VITALS — BP 153/72 | HR 72 | Temp 98.4°F | Resp 17 | Ht 63.5 in | Wt 175.2 lb

## 2021-02-02 DIAGNOSIS — I10 Essential (primary) hypertension: Secondary | ICD-10-CM | POA: Diagnosis not present

## 2021-02-02 DIAGNOSIS — R9431 Abnormal electrocardiogram [ECG] [EKG]: Secondary | ICD-10-CM

## 2021-02-02 DIAGNOSIS — R0989 Other specified symptoms and signs involving the circulatory and respiratory systems: Secondary | ICD-10-CM

## 2021-02-02 DIAGNOSIS — Z8619 Personal history of other infectious and parasitic diseases: Secondary | ICD-10-CM | POA: Diagnosis not present

## 2021-02-02 DIAGNOSIS — R0602 Shortness of breath: Secondary | ICD-10-CM

## 2021-02-02 NOTE — Progress Notes (Signed)
Primary Physician/Referring:  Geannie Risen, MD  Patient ID: Suzanne Barton, female    DOB: [birth year], 70 y.o.   MRN: [id number]  Chief Complaint  Patient presents with  . New Patient (Initial Visit)  . Hypertension  . Abnormal ECG    Ref by Dr Geannie Risen   HPI:    Suzanne Barton  is a 70 y.o. Caucasian female patient with hypertension controlled by diet, but recently since approximately February - March 2022, has had persistent elevated blood pressure with physician visits.  States that she had steroid injections to her joint and since then blood pressure has remained elevated.  She has also noticed "easily tired" with routine activities and mild dyspnea.  Denies any chest tightness or chest pain or palpitations.  She is referred to me for evaluation of abnormal EKG and hypertension as well.  She has lost about 30 Lbs in the past couple years. She is limited by generalized arthritis and back pain.   Past Medical History:  Diagnosis Date  . Allergy   . Anemia   . Blood transfusion   . Femur fracture, right ([place]) 02/10/2016  . GERD (gastroesophageal reflux disease)   . Hepatitis C 1992   in remission  . Hyperlipidemia    no meds  . Neuromuscular disorder ([place])    hiatal hernia  . Right patella fracture   . Seasonal allergies    Past Surgical History:  Procedure Laterality Date  . APPENDECTOMY    . CHOLECYSTECTOMY    . cyst removed     x3- under right arm, x3- groin area- as a teenager  . FEMUR IM NAIL Right 02/10/2016   Procedure: INTRAMEDULLARY (IM) NAIL FEMORAL;  Surgeon: Marchia Bond, MD;  Location: [place];  Service: Orthopedics;  Laterality: Right;  . FRACTURE SURGERY  1973   right femur  . FRACTURE SURGERY     left thumb  . knee arthoscopy     x3  . LIVER BIOPSY     x12  . ORIF PATELLA Right 03/07/2019   Procedure: OPEN REDUCTION INTERNAL (ORIF) FIXATION PATELLA;  Surgeon: Marchia Bond, MD;  Location: [place];  Service:  Orthopedics;  Laterality: Right;  . OVARIAN CYST SURGERY    . PARTIAL HYSTERECTOMY     had partial first, then later had all female organs removed  . SPINE SURGERY  11-13-1999   vertebrae fusion  . TONSILLECTOMY AND ADENOIDECTOMY    . TUBAL LIGATION     Family History  Problem Relation Age of Onset  . Heart failure Father 38  . Lung cancer Mother   . Cancer Mother        lung and brain  . COPD Sister   . Lung cancer Sister   . Fibromyalgia Sister   . Colon cancer Neg Hx   . Esophageal cancer Neg Hx   . Stomach cancer Neg Hx   . Rectal cancer Neg Hx     Social History   Tobacco Use  . Smoking status: Never Smoker  . Smokeless tobacco: Never Used  . Tobacco comment: husband smoked x 13 yrs  Substance Use Topics  . Alcohol use: No   Marital Status: Married  ROS  Review of Systems  Cardiovascular: Positive for dyspnea on exertion. Negative for chest pain and leg swelling.  Musculoskeletal: Positive for back pain and joint pain.  Gastrointestinal: Negative for melena.   Objective  Blood pressure (!) 153/72, pulse 72, temperature 98.4 F (36.9 C),  temperature source Temporal, resp. rate 17, height 5' 3.5" (1.613 m), weight 175 lb 3.2 oz (79.5 kg), SpO2 98 %. Body mass index is 30.55 kg/m.  Vitals with BMI 02/02/2021 02/02/2021 09/12/2019  Height - 5' 3.5" -  Weight - 175 lbs 3 oz -  BMI - 24.40 -  Systolic 102 725 366  Diastolic 72 73 49  Pulse 72 56 93     Physical Exam Constitutional:      Appearance: She is obese.  Neck:     Vascular: Carotid bruit (left) present. No JVD.  Cardiovascular:     Rate and Rhythm: Normal rate and regular rhythm.     Pulses: Normal pulses and intact distal pulses.     Heart sounds: Normal heart sounds. No murmur heard. No gallop.   Pulmonary:     Effort: Pulmonary effort is normal.     Breath sounds: Normal breath sounds.  Abdominal:     General: Bowel sounds are normal.     Palpations: Abdomen is soft.  Musculoskeletal:         General: No swelling.      Laboratory examination:   No results for input(s): NA, K, CL, CO2, GLUCOSE, BUN, CREATININE, CALCIUM, GFRNONAA, GFRAA in the last 8760 hours. CrCl cannot be calculated (Patient's most recent lab result is older than the maximum 21 days allowed.).  CMP Latest Ref Rng & Units 02/12/2016 02/11/2016 02/10/2016  Glucose 65 - 99 mg/dL 86 131(H) 130(H)  BUN 6 - 20 mg/dL <5(L) 7 6  Creatinine 0.44 - 1.00 mg/dL 0.92 0.96 1.03(H)  Sodium 135 - 145 mmol/L 138 136 139  Potassium 3.5 - 5.1 mmol/L 4.1 4.3 3.8  Chloride 101 - 111 mmol/L 104 103 105  CO2 22 - 32 mmol/L 27 28 28   Calcium 8.9 - 10.3 mg/dL 8.5(L) 8.8(L) 9.1   CBC Latest Ref Rng & Units 02/13/2016 02/12/2016 02/11/2016  WBC 4.0 - 10.5 K/uL 5.5 5.4 8.9  Hemoglobin 12.0 - 15.0 g/dL 8.6(L) 8.5(L) 10.0(L)  Hematocrit 36.0 - 46.0 % 26.0(L) 26.6(L) 31.1(L)  Platelets 150 - 400 K/uL 155 144(L) 179    Lipid Panel Recent Labs    02/02/21 1426  CHOL 270*  TRIG 158*  LDLCALC 187*  HDL 54  LDLDIRECT 182*   Lipid Panel     Component Value Date/Time   CHOL 270 (H) 02/02/2021 1426   TRIG 158 (H) 02/02/2021 1426   HDL 54 02/02/2021 1426   LDLCALC 187 (H) 02/02/2021 1426   LDLDIRECT 182 (H) 02/02/2021 1426   LABVLDL 29 02/02/2021 1426     HEMOGLOBIN A1C No results found for: HGBA1C, MPG TSH No results for input(s): TSH in the last 8760 hours.  External labs:   Normal CBC, CMP and TSH.  External labs reviewed performed recently by PCP.  Medications and allergies   Allergies  Allergen Reactions  . Tussionex Pennkinetic Er [Hydrocod Polst-Cpm Polst Er] Hives  . Amoxicillin Rash    2 GM CEFAZOLIN ADMINISTERED WITHOUT ANY DIFFICULTIES OR REACTIONS 02/10/2016 @ 1900  . Ibuprofen Rash  . Sulfa Antibiotics Rash    Medication prior to this encounter:   Outpatient Medications Prior to Visit  Medication Sig Dispense Refill  . beclomethasone (QVAR) 80 MCG/ACT inhaler Inhale 1 puff into the lungs 2 (two) times daily.     Marland Kitchen HYDROcodone-acetaminophen (NORCO) 10-325 MG tablet Take 1-2 tablets by mouth every 6 (six) hours as needed. 30 tablet 0  . oxyCODONE-acetaminophen (PERCOCET/ROXICET) 5-325 MG tablet Take 1  tablet by mouth every 4 (four) hours as needed for severe pain. 15 tablet 0  . Pseudoeph-CPM-DM-APAP (ALKA-SELTZER PLUS COLD/COUGH) 30-2-10-250 MG CAPS Take 1 capsule by mouth as directed.       No facility-administered medications prior to visit.   SHE IS NOT TAKING ANY OF THE ABOVE PRESENTLY  FINAL MEDICATION AS OF TODAY:   No outpatient medications have been marked as taking for the 02/02/21 encounter (Office Visit) with Adrian Prows, MD.    Radiology:   No results found.  Cardiac Studies:   CBC, CMP normal recently by PCP, labs reviewed. No lipid panel. 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  Incomplete right bundle branch block.  No evidence of ischemia. No significant change from FROM pcp ekg    Assessment     ICD-10-CM   1. Primary hypertension  I10 EKG 12-Lead    Lipid Panel With LDL/HDL Ratio    LDL cholesterol, direct  2. Nonspecific abnormal electrocardiogram (ECG) (EKG)  R94.31 PCV ECHOCARDIOGRAM COMPLETE    PCV MYOCARDIAL PERFUSION WITH LEXISCAN  3. Shortness of breath  R06.02 PCV ECHOCARDIOGRAM COMPLETE    PCV MYOCARDIAL PERFUSION WITH LEXISCAN  4. History of hepatitis C S/P Interferon, Rhibiran therapy 2001  Z86.19   5. Left carotid bruit  R09.89 PCV CAROTID DUPLEX (BILATERAL)     Medications Discontinued During This Encounter  Medication Reason  . oxyCODONE-acetaminophen (PERCOCET/ROXICET) 5-325 MG tablet Error  . HYDROcodone-acetaminophen (NORCO) 10-325 MG tablet Error  . Pseudoeph-CPM-DM-APAP (ALKA-SELTZER PLUS COLD/COUGH) 30-2-10-250 MG CAPS Error  . beclomethasone (QVAR) 80 MCG/ACT inhaler Error    No orders of the defined types were placed in this encounter.  Orders Placed This  Encounter  Procedures  . Lipid Panel With LDL/HDL Ratio  . LDL cholesterol, direct  . PCV MYOCARDIAL PERFUSION WITH LEXISCAN    Standing Status:   Future    Standing Expiration Date:   04/04/2021  . EKG 12-Lead  . PCV ECHOCARDIOGRAM COMPLETE    Standing Status:   Future    Number of Occurrences:   1    Standing Expiration Date:   02/02/2022   Recommendations:   Suzanne Barton is a 70 y.o. Caucasian female patient with hypertension controlled by diet, but recently since approximately February - March 2022, has had persistent elevated blood pressure with physician visits.  States that she had steroid injections to her joint and since then blood pressure has remained elevated.  She has also noticed "easily tired" with routine activities and mild dyspnea.  Denies any chest tightness or chest pain or palpitations.  He is referred to me for evaluation of abnormal EKG and hypertension as well.  She does not want to take any medications unless absolutely is necessary.  She is concerned about patient's history of hepatitis C and potential for hepatotoxicity with the medications.  Although her blood pressure was elevated I did not make any changes to her medications, she has lost about 30 pounds in weight over the past couple years, additional 10 to 15 pounds will certainly help with blood pressure control.  In view of abnormal EKG, I cannot exclude inferior infarct.  We will obtain an echocardiogram.  She is unable to exercise and in view of dyspnea and generalized exertional related fatigue, will obtain Lexiscan nuclear stress test.   Labs reviewed, I would like to obtain lipid profile testing.  I would like to  see him back in 4 to 6 weeks for follow-up.  Addendum: I have reviewed her lipid profile testing and she has marked hyperlipidemia, needs therapy, will discuss on next office visit.  Patient is skeptical about starting medications hence would like to do face-to-face encounter.    Adrian Prows, MD, [facility] 02/03/2021, 5:44 PM Office: [phone]

## 2021-02-03 ENCOUNTER — Ambulatory Visit: Payer: BC Managed Care – PPO

## 2021-02-03 DIAGNOSIS — R9431 Abnormal electrocardiogram [ECG] [EKG]: Secondary | ICD-10-CM | POA: Diagnosis not present

## 2021-02-03 DIAGNOSIS — R0602 Shortness of breath: Secondary | ICD-10-CM | POA: Diagnosis not present

## 2021-02-03 LAB — LIPID PANEL WITH LDL/HDL RATIO
Cholesterol, Total: 270 mg/dL — ABNORMAL HIGH (ref 100–199)
HDL: 54 mg/dL (ref >39)
LDL Chol Calc (NIH): 187 mg/dL — ABNORMAL HIGH (ref 0–99)
LDL/HDL Ratio: 3.5 ratio — ABNORMAL HIGH (ref 0.0–3.2)
Triglycerides: 158 mg/dL — ABNORMAL HIGH (ref 0–149)
VLDL Cholesterol Cal: 29 mg/dL (ref 5–40)

## 2021-02-03 LAB — LDL CHOLESTEROL, DIRECT: LDL Direct: 182 mg/dL — ABNORMAL HIGH (ref 0–99)

## 2021-02-08 ENCOUNTER — Other Ambulatory Visit: Payer: Self-pay | Admitting: Family Medicine

## 2021-02-09 NOTE — Progress Notes (Signed)
Labs 01/19/2021:  Serum glucose 81 mg, BUN 13, creatinine 0.82, EGFR 78 mL, potassium 4.2, CMP otherwise normal.  TSH normal, free T4 normal.  A1c 5.0%.  Hb 13.9/HCT 40.5, platelets 165.  Normal indicis.  C-reactive protein 2.0, normal.

## 2021-02-09 NOTE — Progress Notes (Signed)
Echocardiogram 02/03/2021: Normal LV systolic function with visual EF 60-65%. Left ventricle cavity is normal in size. Normal global wall motion. Normal diastolic filling pattern, normal LAP.  Mild (Grade I) aortic regurgitation. No prior study for comparison.

## 2021-02-14 ENCOUNTER — Ambulatory Visit: Payer: BC Managed Care – PPO

## 2021-02-14 ENCOUNTER — Other Ambulatory Visit: Payer: Self-pay

## 2021-02-14 DIAGNOSIS — R0602 Shortness of breath: Secondary | ICD-10-CM | POA: Diagnosis not present

## 2021-02-14 DIAGNOSIS — R9431 Abnormal electrocardiogram [ECG] [EKG]: Secondary | ICD-10-CM

## 2021-02-15 ENCOUNTER — Ambulatory Visit: Payer: BC Managed Care – PPO

## 2021-02-15 DIAGNOSIS — I6523 Occlusion and stenosis of bilateral carotid arteries: Secondary | ICD-10-CM

## 2021-02-15 DIAGNOSIS — R0989 Other specified symptoms and signs involving the circulatory and respiratory systems: Secondary | ICD-10-CM | POA: Diagnosis not present

## 2021-02-16 NOTE — Progress Notes (Signed)
Lexiscan Tetrofosmin stress test 02/14/2021: 1 Day Rest/Stress Protocol. Stress EKG is non-diagnostic, as this is pharmacological stress test using Lexiscan. Normal myocardial perfusion. No convincing evidence of reversible myocardial ischemia or prior infarct.   Left ventricular ejection fraction is 58% with normal wall motion.   No prior studies for comparison. Low risk study.

## 2021-02-20 ENCOUNTER — Other Ambulatory Visit: Payer: Self-pay | Admitting: Cardiology

## 2021-02-20 DIAGNOSIS — I6523 Occlusion and stenosis of bilateral carotid arteries: Secondary | ICD-10-CM

## 2021-02-20 NOTE — Progress Notes (Signed)
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 Stenosis in the right external carotid artery (<50%). Suggests stenosis in the left internal carotid artery (50-69%). Stenosis in the left carotid bifurcation of <50%. Stenosis in the left external carotid artery (<50%). There is homogeneous plaque throughout the bilateral carotid arteries. Antegrade right vertebral artery flow. Antegrade left vertebral artery flow. Follow up in six months is appropriate if clinically indicated.

## 2021-02-22 DIAGNOSIS — H0100A Unspecified blepharitis right eye, upper and lower eyelids: Secondary | ICD-10-CM | POA: Diagnosis not present

## 2021-02-22 DIAGNOSIS — H04123 Dry eye syndrome of bilateral lacrimal glands: Secondary | ICD-10-CM | POA: Diagnosis not present

## 2021-03-16 ENCOUNTER — Ambulatory Visit: Payer: BC Managed Care – PPO | Admitting: Cardiology

## 2021-03-16 ENCOUNTER — Encounter: Payer: Self-pay | Admitting: Cardiology

## 2021-03-16 ENCOUNTER — Other Ambulatory Visit: Payer: Self-pay

## 2021-03-16 VITALS — BP 138/68 | HR 69 | Temp 98.6°F | Resp 17 | Ht 63.5 in | Wt 172.6 lb

## 2021-03-16 DIAGNOSIS — R9431 Abnormal electrocardiogram [ECG] [EKG]: Secondary | ICD-10-CM

## 2021-03-16 DIAGNOSIS — I6523 Occlusion and stenosis of bilateral carotid arteries: Secondary | ICD-10-CM

## 2021-03-16 DIAGNOSIS — I10 Essential (primary) hypertension: Secondary | ICD-10-CM | POA: Diagnosis not present

## 2021-03-16 DIAGNOSIS — E7800 Pure hypercholesterolemia, unspecified: Secondary | ICD-10-CM

## 2021-03-16 MED ORDER — ASPIRIN 81 MG PO CHEW: 81.0000 mg | CHEWABLE_TABLET | Freq: Every day | ORAL | Status: DC

## 2021-03-16 MED ORDER — EZETIMIBE-SIMVASTATIN 10-40 MG PO TABS: 1.0000 | ORAL_TABLET | Freq: Every day | ORAL | 2 refills | Status: DC

## 2021-03-16 NOTE — Progress Notes (Signed)
Primary Physician/Referring:  Darrin Nipper, MD  Patient ID: Suzanne Barton, female    DOB: 24-Sep-1950, 70 y.o.   MRN: [id number]  Chief Complaint  Patient presents with   Follow-up   Shortness of Breath   Abnormal ECG    6 weeks   HPI:    Suzanne Barton  is a 70 y.o. Caucasian female patient with labile hypertension controlled by diet, but since February 2022 had noticed elevated blood pressure during MD visits.  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  States that she had steroid injections to her joint and since then blood pressure has remained elevated but over time has noticed blood pressure returning back to baseline.  She has also noticed "easily tired" with routine activities and mild dyspnea.  Denies any chest tightness or chest pain or palpitations. She has lost about 30 Lbs in the past couple years. She is limited by generalized arthritis and back pain.  She underwent echocardiogram, nuclear stress test and also carotid artery duplex and presents for follow-up.  She has noticed occasional palpitations but no chest pain.  No change in fatigue.  No leg edema, no PND or orthopnea.  Past Medical History:  Diagnosis Date   Allergy    Anemia    Blood transfusion    Femur fracture, right (HCC) 02/10/2016   GERD (gastroesophageal reflux disease)    Hepatitis C 1992   in remission   Hyperlipidemia    no meds   Neuromuscular disorder (HCC)    hiatal hernia   Right patella fracture    Seasonal allergies    Past Surgical History:  Procedure Laterality Date   APPENDECTOMY     CHOLECYSTECTOMY     cyst removed     x3- under right arm, x3- groin area- as a teenager   FEMUR IM NAIL Right 02/10/2016   Procedure: INTRAMEDULLARY (IM) NAIL FEMORAL;  Surgeon: Teryl Lucy, MD;  Location: [facility];  Service: Orthopedics;  Laterality: Right;   FRACTURE  SURGERY  1973   right femur   FRACTURE SURGERY     left thumb   knee arthoscopy     x3   LIVER BIOPSY     x12   ORIF PATELLA Right 03/07/2019   Procedure: OPEN REDUCTION INTERNAL (ORIF) FIXATION PATELLA;  Surgeon: Teryl Lucy, MD;  Location: [facility];  Service: Orthopedics;  Laterality: Right;   OVARIAN CYST SURGERY     PARTIAL HYSTERECTOMY     had partial first, then later had all female organs removed   SPINE SURGERY  11-13-1999   vertebrae fusion   TONSILLECTOMY AND ADENOIDECTOMY     TUBAL LIGATION     Family History  Problem Relation Age of Onset   Heart failure Father 49   Lung cancer Mother    Cancer Mother        lung and brain   COPD Sister    Lung cancer Sister    Fibromyalgia Sister    Colon cancer Neg Hx    Esophageal cancer Neg Hx    Stomach cancer Neg Hx    Rectal cancer Neg Hx     Social History   Tobacco Use   Smoking status: Never   Smokeless tobacco: Never   Tobacco comments:    husband smoked x 13 yrs  Substance Use Topics   Alcohol use: No  Marital Status: Married  ROS  Review of Systems  Cardiovascular:  Positive for dyspnea on exertion and palpitations. Negative for chest pain and leg swelling.  Musculoskeletal:  Positive for back pain and joint pain.  Gastrointestinal:  Negative for melena.  Objective  Blood pressure 138/68, pulse 69, temperature 98.6 F (37 C), temperature source Temporal, resp. rate 17, height 5' 3.5" (1.613 m), weight 172 lb 9.6 oz (78.3 kg), SpO2 100 %. Body mass index is 30.1 kg/m.  Vitals with BMI 03/16/2021 03/16/2021 02/02/2021  Height - 5' 3.5" -  Weight - 172 lbs 10 oz -  BMI - 10.62 -  Systolic [phone]  Diastolic 68 68 72  Pulse 69 74 72     Physical Exam Constitutional:      Appearance: She is obese.  Neck:     Vascular: Carotid bruit (left) present. No JVD.  Cardiovascular:     Rate and Rhythm: Normal rate and regular rhythm.     Pulses: Normal pulses and intact distal pulses.      Heart sounds: Normal heart sounds. No murmur heard.   No gallop.  Pulmonary:     Effort: Pulmonary effort is normal.     Breath sounds: Normal breath sounds.  Abdominal:     General: Bowel sounds are normal.     Palpations: Abdomen is soft.  Musculoskeletal:        General: No swelling.    Laboratory examination:   No results for input(s): NA, K, CL, CO2, GLUCOSE, BUN, CREATININE, CALCIUM, GFRNONAA, GFRAA in the last 8760 hours. CrCl cannot be calculated (Patient's most recent lab result is older than the maximum 21 days allowed.).  CMP Latest Ref Rng & Units 02/12/2016 02/11/2016 02/10/2016  Glucose 65 - 99 mg/dL 86 131(H) 130(H)  BUN 6 - 20 mg/dL <5(L) 7 6  Creatinine 0.44 - 1.00 mg/dL 0.92 0.96 1.03(H)  Sodium 135 - 145 mmol/L 138 136 139  Potassium 3.5 - 5.1 mmol/L 4.1 4.3 3.8  Chloride 101 - 111 mmol/L 104 103 105  CO2 22 - 32 mmol/L $RemoveB'27 28 28  '[id number]$ Calcium 8.9 - 10.3 mg/dL 8.5(L) 8.8(L) 9.1   CBC Latest Ref Rng & Units 02/13/2016 02/12/2016 02/11/2016  WBC 4.0 - 10.5 K/uL 5.5 5.4 8.9  Hemoglobin 12.0 - 15.0 g/dL 8.6(L) 8.5(L) 10.0(L)  Hematocrit 36.0 - 46.0 % 26.0(L) 26.6(L) 31.1(L)  Platelets 150 - 400 K/uL 155 144(L) 179   Lipid Panel Recent Labs    02/02/21 1426  CHOL 270*  TRIG 158*  LDLCALC 187*  HDL 54  LDLDIRECT 182*   Lipid Panel     Component Value Date/Time   CHOL 270 (H) 02/02/2021 1426   TRIG 158 (H) 02/02/2021 1426   HDL 54 02/02/2021 1426   LDLCALC 187 (H) 02/02/2021 1426   LDLDIRECT 182 (H) 02/02/2021 1426   LABVLDL 29 02/02/2021 1426    CBC, CMP normal recently by PCP, labs reviewed. No lipid panel. TSH normal HEMOGLOBIN A1C No results found for: HGBA1C, MPG TSH No results for input(s): TSH in the last 8760 hours.  External labs:   Labs 01/19/2021:  Serum glucose 81 mg, BUN 13, creatinine 0.82, EGFR 78 mL, potassium 4.2, CMP otherwise normal.  TSH normal, free T4 normal.  A1c 5.0%.  Hb 13.9/HCT 40.5, platelets 165.  Normal indicis.  C-reactive  protein 2.0, normal.  Medications and allergies   Allergies  Allergen Reactions   Tussionex Pennkinetic Er [Hydrocod Polst-Cpm Polst Er] Hives   Amoxicillin  Rash    2 GM CEFAZOLIN ADMINISTERED WITHOUT ANY DIFFICULTIES OR REACTIONS 02/10/2016 @ 1900   Ibuprofen Rash   Sulfa Antibiotics Rash    Medication prior to this encounter:   Outpatient Medications Prior to Visit  Medication Sig Dispense Refill   Vitamin D, Ergocalciferol, (DRISDOL) 1.25 MG (50000 UNIT) CAPS capsule Take 50,000 Units by mouth once a week.     No facility-administered medications prior to visit.    FINAL MEDICATION AS OF TODAY:   Current Meds  Medication Sig   aspirin (ASPIRIN CHILDRENS) 81 MG chewable tablet Chew 1 tablet (81 mg total) by mouth daily.   ezetimibe-simvastatin (VYTORIN) 10-40 MG tablet Take 1 tablet by mouth daily at 6 PM.   Radiology:   No results found.  Cardiac Studies:   Echocardiogram 02/03/2021: Normal LV systolic function with visual EF 60-65%. Left ventricle cavity is normal in size. Normal global wall motion. Normal diastolic filling pattern, normal LAP.  Mild (Grade I) aortic regurgitation. No prior study for comparison.  Lexiscan Tetrofosmin stress test 02/14/2021: 1 Day Rest/Stress Protocol. Stress EKG is non-diagnostic, as this is pharmacological stress test using Lexiscan. Normal myocardial perfusion. No convincing evidence of reversible myocardial ischemia or prior infarct.   Left ventricular ejection fraction is 58% with normal wall motion.   No prior studies for comparison. Low risk study.  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 Stenosis in the right external carotid artery (<50%). Suggests stenosis in the left internal carotid artery (50-69%). Stenosis in the left carotid bifurcation of <50%. Stenosis in the left external carotid artery (<50%). There is homogeneous plaque throughout the bilateral carotid  arteries. Antegrade right vertebral artery flow. Antegrade left vertebral artery flow. Follow up in six months is appropriate if clinically indicated.  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  No evidence of ischemia no significant change from 02/02/2021.   Assessment     ICD-10-CM   1. Primary hypertension  I10 EKG 12-Lead    2. Asymptomatic bilateral carotid artery stenosis  I65.23 aspirin (ASPIRIN CHILDRENS) 81 MG chewable tablet    3. Hypercholesteremia  E78.00 ezetimibe-simvastatin (VYTORIN) 10-40 MG tablet    4. Nonspecific abnormal electrocardiogram (ECG) (EKG)  R94.31       There are no discontinued medications.   Meds ordered this encounter  Medications   aspirin (ASPIRIN CHILDRENS) 81 MG chewable tablet    Sig: Chew 1 tablet (81 mg total) by mouth daily.   ezetimibe-simvastatin (VYTORIN) 10-40 MG tablet    Sig: Take 1 tablet by mouth daily at 6 PM.    Dispense:  30 tablet    Refill:  2    [facility]    Orders Placed This Encounter  Procedures   EKG 12-Lead   Recommendations:   Suzanne Barton is a 70 y.o. Caucasian female patient with labile hypertension controlled by diet, but since February 2022 had noticed elevated blood pressure during MD visits.  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  I reviewed the results of the stress test and echocardiogram and reassured her.  Suspect her fatigue is probably related to deconditioning.  Blood pressure is normal today.  Weight loss will certainly help along with exercise.  I reviewed the results of the carotid artery duplex, she has moderate disease in bilateral carotids and  lipids are markedly abnormal.  Patient is willing to start statins.  I was started on aspirin 81 mg daily along with Vytorin 10/40 mg in the evening.  She will need lipid profile testing 8-12  weeks, she will obtain this at her workplace and bring [place] the results.  I will see her back in 3 months for follow-up of lipids and further discussion.  She will also need carotid artery surveillance which we will recheck in 6 months.  I have discussed with her that there is no indication for surgical intervention at this time.  Primary prevention/secondary prevention discussed.  Patient appears very motivated.     Adrian Prows, MD, [facility] 03/16/2021, 6:56 PM Office: [phone]

## 2021-05-17 ENCOUNTER — Telehealth: Payer: Self-pay | Admitting: Cardiology

## 2021-05-17 NOTE — Telephone Encounter (Signed)
Pt is calling wanting to know if it is safe for her to receive cortozon shots in her hips/knees for pain

## 2021-05-17 NOTE — Telephone Encounter (Signed)
Pt is calling wanting to know if it is safe for her to receive cortozon shots in her hips/knees for pain.

## 2021-05-18 NOTE — Telephone Encounter (Signed)
Pt called back today, asking again if safe for her to receive cortisone shots from her other doctor.

## 2021-05-18 NOTE — Telephone Encounter (Signed)
Called pt no answer. Left VM requesting call back.

## 2021-05-18 NOTE — Telephone Encounter (Signed)
Please notify patient, see no contraindications from cardiovascular standpoint for cortisone injections.

## 2021-05-20 NOTE — Telephone Encounter (Signed)
Called and spoke to pt, pt voiced understanding.

## 2021-05-23 DIAGNOSIS — M160 Bilateral primary osteoarthritis of hip: Secondary | ICD-10-CM | POA: Diagnosis not present

## 2021-05-23 DIAGNOSIS — M1711 Unilateral primary osteoarthritis, right knee: Secondary | ICD-10-CM | POA: Diagnosis not present

## 2021-05-31 ENCOUNTER — Ambulatory Visit
Admission: RE | Admit: 2021-05-31 | Discharge: 2021-05-31 | Disposition: A | Discharge status: Home / Self Care | Payer: BC Managed Care – PPO | Source: Ambulatory Visit | Attending: Family Medicine | Admitting: Family Medicine

## 2021-05-31 ENCOUNTER — Other Ambulatory Visit: Payer: Self-pay

## 2021-05-31 DIAGNOSIS — M25531 Pain in right wrist: Secondary | ICD-10-CM | POA: Diagnosis not present

## 2021-05-31 DIAGNOSIS — W19XXXA Unspecified fall, initial encounter: Secondary | ICD-10-CM

## 2021-05-31 DIAGNOSIS — M19031 Primary osteoarthritis, right wrist: Secondary | ICD-10-CM | POA: Diagnosis not present

## 2021-06-06 DIAGNOSIS — M1612 Unilateral primary osteoarthritis, left hip: Secondary | ICD-10-CM | POA: Diagnosis not present

## 2021-06-14 NOTE — Progress Notes (Signed)
06/06/2021: BUN 19, creatinine 0.90, GFR >60, sodium 138, potassium 4.9 Hgb 13.4, HCT 40.6, MCV 91, platelet 183 Total cholesterol 147, triglycerides 68, HDL 75, LDL 58 TSH 4.19

## 2021-06-15 NOTE — Progress Notes (Signed)
Primary Physician/Referring:  Geannie Risen, MD  Patient ID: Suzanne Barton, female    DOB: [birth year], 70 y.o.   MRN: [id number]  Chief Complaint  Patient presents with   Follow-up   Shortness of Breath   Abnormal ECG    6 weeks   HPI:    Suzanne Barton  is a 70 y.o. Caucasian female patient with labile hypertension controlled by diet, but since February 2022 had noticed elevated blood pressure during MD visits.  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  Patient presents for 45-month follow-up.  Last office visit started aspirin 81 mg and Zetia/simvastatin and moderate bilateral carotid artery stenosis.  Repeat lipid profile testing reveals lipids are well controlled.  She is tolerating addition of aspirin as well as Zetia/simvastatin without issue.  She has been exercising more since last office visit.  Denies chest pain, palpitations, syncope, near syncope, dyspnea.  Notably patient fell 2 days ago and injured her right leg which is causing her significant pain presently, she is seen orthopedics tomorrow.  This was a mechanical fall after she tripped on something in her pantry.  Past Medical History:  Diagnosis Date   Allergy    Anemia    Blood transfusion    Femur fracture, right ([place]) 02/10/2016   GERD (gastroesophageal reflux disease)    Hepatitis C 1992   in remission   Hyperlipidemia    no meds   Neuromuscular disorder (HCC)    hiatal hernia   Right patella fracture    Seasonal allergies    Past Surgical History:  Procedure Laterality Date   APPENDECTOMY     CHOLECYSTECTOMY     cyst removed     x3- under right arm, x3- groin area- as a teenager   FEMUR IM NAIL Right 02/10/2016   Procedure: INTRAMEDULLARY (IM) NAIL FEMORAL;  Surgeon: Marchia Bond, MD;  Location: [facility];  Service: Orthopedics;  Laterality: Right;   FRACTURE SURGERY  1973    right femur   FRACTURE SURGERY     left thumb   knee arthoscopy     x3   LIVER BIOPSY     x12   ORIF PATELLA Right 03/07/2019   Procedure: OPEN REDUCTION INTERNAL (ORIF) FIXATION PATELLA;  Surgeon: Marchia Bond, MD;  Location: [facility];  Service: Orthopedics;  Laterality: Right;   OVARIAN CYST SURGERY     PARTIAL HYSTERECTOMY     had partial first, then later had all female organs removed   SPINE SURGERY  11-13-1999   vertebrae fusion   TONSILLECTOMY AND ADENOIDECTOMY     TUBAL LIGATION     Family History  Problem Relation Age of Onset   Heart failure Father 50   Lung cancer Mother    Cancer Mother        lung and brain   COPD Sister    Lung cancer Sister    Fibromyalgia Sister    Colon cancer Neg Hx    Esophageal cancer Neg Hx    Stomach cancer Neg Hx    Rectal cancer Neg Hx     Social History   Tobacco Use   Smoking status: Never   Smokeless tobacco: Never   Tobacco comments:    husband smoked x 13 yrs  Substance Use Topics   Alcohol use: No   Marital Status: Married  ROS  Review of  Systems  Cardiovascular:  Negative for chest pain, dyspnea on exertion (improving), leg swelling and palpitations (improved).  Musculoskeletal:  Positive for back pain and joint pain.  Gastrointestinal:  Negative for melena.  Objective  Blood pressure 138/68, pulse 69, temperature 98.6 F (37 C), temperature source Temporal, resp. rate 17, height 5' 3.5" (1.613 m), weight 172 lb 9.6 oz (78.3 kg), SpO2 100 %. Body mass index is 30.1 kg/m.  Vitals with BMI 03/16/2021 03/16/2021 02/02/2021  Height - 5' 3.5" -  Weight - 172 lbs 10 oz -  BMI - 51.70 -  Systolic 017 494 496  Diastolic 68 68 72  Pulse 69 74 72     Physical Exam Vitals reviewed.  Constitutional:      Appearance: She is obese.  Neck:     Vascular: Carotid bruit (left) present. No JVD.  Cardiovascular:     Rate and Rhythm: Normal rate and regular rhythm.     Pulses: Normal pulses and intact distal  pulses.     Heart sounds: Normal heart sounds. No murmur heard.   No gallop.  Pulmonary:     Effort: Pulmonary effort is normal.     Breath sounds: Normal breath sounds.  Musculoskeletal:     Right lower leg: No edema.     Left lower leg: No edema.    Laboratory examination:   No results for input(s): NA, K, CL, CO2, GLUCOSE, BUN, CREATININE, CALCIUM, GFRNONAA, GFRAA in the last 8760 hours. CrCl cannot be calculated (Patient's most recent lab result is older than the maximum 21 days allowed.).  CMP Latest Ref Rng & Units 02/12/2016 02/11/2016 02/10/2016  Glucose 65 - 99 mg/dL 86 131(H) 130(H)  BUN 6 - 20 mg/dL <5(L) 7 6  Creatinine 0.44 - 1.00 mg/dL 0.92 0.96 1.03(H)  Sodium 135 - 145 mmol/L 138 136 139  Potassium 3.5 - 5.1 mmol/L 4.1 4.3 3.8  Chloride 101 - 111 mmol/L 104 103 105  CO2 22 - 32 mmol/L $RemoveB'27 28 28  '[id number]$ Calcium 8.9 - 10.3 mg/dL 8.5(L) 8.8(L) 9.1   CBC Latest Ref Rng & Units 02/13/2016 02/12/2016 02/11/2016  WBC 4.0 - 10.5 K/uL 5.5 5.4 8.9  Hemoglobin 12.0 - 15.0 g/dL 8.6(L) 8.5(L) 10.0(L)  Hematocrit 36.0 - 46.0 % 26.0(L) 26.6(L) 31.1(L)  Platelets 150 - 400 K/uL 155 144(L) 179   Lipid Panel Recent Labs    02/02/21 1426  CHOL 270*  TRIG 158*  LDLCALC 187*  HDL 54  LDLDIRECT 182*   Lipid Panel     Component Value Date/Time   CHOL 270 (H) 02/02/2021 1426   TRIG 158 (H) 02/02/2021 1426   HDL 54 02/02/2021 1426   LDLCALC 187 (H) 02/02/2021 1426   LDLDIRECT 182 (H) 02/02/2021 1426   LABVLDL 29 02/02/2021 1426    CBC, CMP normal recently by PCP, labs reviewed. No lipid panel. TSH normal HEMOGLOBIN A1C No results found for: HGBA1C, MPG TSH No results for input(s): TSH in the last 8760 hours.  External labs:  06/06/2021: BUN 19, creatinine 0.90, GFR >60, sodium 138, potassium 4.9 Hgb 13.4, HCT 40.6, MCV 91, platelet 183 Total cholesterol 147, triglycerides 68, HDL 75, LDL 58 TSH 4.19  01/19/2021: Serum glucose 81 mg, BUN 13, creatinine 0.82, EGFR 78 mL, potassium 4.2,  CMP otherwise normal.  TSH normal, free T4 normal.  A1c 5.0%.  Hb 13.9/HCT 40.5, platelets 165.  Normal indicis.  C-reactive protein 2.0, normal.  Allergies   Allergies  Allergen Reactions   Tussionex Pennkinetic  Er [Hydrocod Polst-Cpm Polst Er] Hives   Amoxicillin Rash    2 GM CEFAZOLIN ADMINISTERED WITHOUT ANY DIFFICULTIES OR REACTIONS 02/10/2016 @ 1900   Ibuprofen Rash   Sulfa Antibiotics Rash    Medication prior to this encounter:   Outpatient Medications Prior to Visit  Medication Sig Dispense Refill   Vitamin D, Ergocalciferol, (DRISDOL) 1.25 MG (50000 UNIT) CAPS capsule Take 50,000 Units by mouth once a week.     No facility-administered medications prior to visit.    FINAL MEDICATION AS OF TODAY:   Current Meds  Medication Sig   aspirin (ASPIRIN CHILDRENS) 81 MG chewable tablet Chew 1 tablet (81 mg total) by mouth daily.   ezetimibe-simvastatin (VYTORIN) 10-40 MG tablet Take 1 tablet by mouth daily at 6 PM.   Radiology:   No results found.  Cardiac Studies:   Echocardiogram 02/03/2021: Normal LV systolic function with visual EF 60-65%. Left ventricle cavity is normal in size. Normal global wall motion. Normal diastolic filling pattern, normal LAP.  Mild (Grade I) aortic regurgitation. No prior study for comparison.  Lexiscan Tetrofosmin stress test 02/14/2021: 1 Day Rest/Stress Protocol. Stress EKG is non-diagnostic, as this is pharmacological stress test using Lexiscan. Normal myocardial perfusion. No convincing evidence of reversible myocardial ischemia or prior infarct.   Left ventricular ejection fraction is 58% with normal wall motion.   No prior studies for comparison. Low risk study.  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 Stenosis in the right external carotid artery (<50%). Suggests stenosis in the left internal carotid artery (50-69%). Stenosis in the left carotid bifurcation of <50%. Stenosis in  the left external carotid artery (<50%). There is homogeneous plaque throughout the bilateral carotid arteries. Antegrade right vertebral artery flow. Antegrade left vertebral artery flow. Follow up in six months is appropriate if clinically indicated.  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  No evidence of ischemia no significant change from 02/02/2021.   Assessment     ICD-10-CM   1. Primary hypertension  I10 EKG 12-Lead    2. Asymptomatic bilateral carotid artery stenosis  I65.23 aspirin (ASPIRIN CHILDRENS) 81 MG chewable tablet    3. Hypercholesteremia  E78.00 ezetimibe-simvastatin (VYTORIN) 10-40 MG tablet    4. Nonspecific abnormal electrocardiogram (ECG) (EKG)  R94.31       There are no discontinued medications.   Meds ordered this encounter  Medications   aspirin (ASPIRIN CHILDRENS) 81 MG chewable tablet    Sig: Chew 1 tablet (81 mg total) by mouth daily.   ezetimibe-simvastatin (VYTORIN) 10-40 MG tablet    Sig: Take 1 tablet by mouth daily at 6 PM.    Dispense:  30 tablet    Refill:  2    [facility]    Orders Placed This Encounter  Procedures   EKG 12-Lead   Recommendations:   Suzanne Barton is a 70 y.o. Caucasian female patient with labile hypertension controlled by diet, but since February 2022 had noticed elevated blood pressure during MD visits.  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  Patient presents for 59-month follow-up.  Last office visit started aspirin 81 mg and Zetia/simvastatin and moderate bilateral carotid artery stenosis.  Repeat lipid profile testing reveals lipids are well controlled.  Patient is tolerating medications without issue and has increased her physical activity which has  also improved her fatigue and dyspnea on exertion.  Patient's blood pressure is elevated in the office  today, likely due to pain in her right leg.  Patient's blood pressure is typically well controlled, will not make changes to medications at this time.  Patient will monitor her blood pressure at home and notify our office if it remains >130/80 mmHg.  Will repeat carotid artery duplex in January 2023 for surveillance.  Congratulated patient on diet and lifestyle modifications and encouraged her to continue to work on weight loss and increased physical activity.  Follow-up in 6 months, sooner if needed, for carotid artery stenosis, hypertension, hyperlipidemia.   Suzanne Berthold, PA-C 06/16/2021, 2:12 PM Office: [phone]

## 2021-06-16 ENCOUNTER — Ambulatory Visit: Payer: BC Managed Care – PPO | Admitting: Student

## 2021-06-16 ENCOUNTER — Other Ambulatory Visit: Payer: Self-pay

## 2021-06-16 ENCOUNTER — Encounter: Payer: Self-pay | Admitting: Student

## 2021-06-16 VITALS — BP 152/57 | HR 70 | Temp 98.2°F | Ht 63.5 in | Wt 178.6 lb

## 2021-06-16 DIAGNOSIS — I6523 Occlusion and stenosis of bilateral carotid arteries: Secondary | ICD-10-CM

## 2021-06-16 DIAGNOSIS — I10 Essential (primary) hypertension: Secondary | ICD-10-CM

## 2021-06-16 DIAGNOSIS — E7800 Pure hypercholesterolemia, unspecified: Secondary | ICD-10-CM

## 2021-06-17 DIAGNOSIS — M25551 Pain in right hip: Secondary | ICD-10-CM | POA: Diagnosis not present

## 2021-06-17 DIAGNOSIS — M25561 Pain in right knee: Secondary | ICD-10-CM | POA: Diagnosis not present

## 2021-06-27 ENCOUNTER — Other Ambulatory Visit: Payer: Self-pay | Admitting: Cardiology

## 2021-06-27 DIAGNOSIS — E7800 Pure hypercholesterolemia, unspecified: Secondary | ICD-10-CM

## 2021-07-10 DIAGNOSIS — M79641 Pain in right hand: Secondary | ICD-10-CM | POA: Diagnosis not present

## 2021-07-10 DIAGNOSIS — M25531 Pain in right wrist: Secondary | ICD-10-CM | POA: Diagnosis not present

## 2021-07-12 DIAGNOSIS — S62324A Displaced fracture of shaft of fourth metacarpal bone, right hand, initial encounter for closed fracture: Secondary | ICD-10-CM | POA: Diagnosis not present

## 2021-07-12 DIAGNOSIS — S62326A Displaced fracture of shaft of fifth metacarpal bone, right hand, initial encounter for closed fracture: Secondary | ICD-10-CM | POA: Diagnosis not present

## 2021-07-14 ENCOUNTER — Ambulatory Visit
Admission: RE | Admit: 2021-07-14 | Discharge: 2021-07-14 | Disposition: A | Discharge status: Home / Self Care | Payer: Self-pay | Source: Ambulatory Visit | Attending: Family | Admitting: Family

## 2021-07-14 ENCOUNTER — Other Ambulatory Visit: Payer: Self-pay | Admitting: Family

## 2021-07-14 DIAGNOSIS — W010XXA Fall on same level from slipping, tripping and stumbling without subsequent striking against object, initial encounter: Secondary | ICD-10-CM

## 2021-07-14 DIAGNOSIS — S20212A Contusion of left front wall of thorax, initial encounter: Secondary | ICD-10-CM

## 2021-07-14 DIAGNOSIS — R0781 Pleurodynia: Secondary | ICD-10-CM | POA: Diagnosis not present

## 2021-07-14 DIAGNOSIS — R059 Cough, unspecified: Secondary | ICD-10-CM

## 2021-07-19 DIAGNOSIS — S62324A Displaced fracture of shaft of fourth metacarpal bone, right hand, initial encounter for closed fracture: Secondary | ICD-10-CM | POA: Diagnosis not present

## 2021-07-19 DIAGNOSIS — S62326A Displaced fracture of shaft of fifth metacarpal bone, right hand, initial encounter for closed fracture: Secondary | ICD-10-CM | POA: Diagnosis not present

## 2021-08-16 DIAGNOSIS — S62326A Displaced fracture of shaft of fifth metacarpal bone, right hand, initial encounter for closed fracture: Secondary | ICD-10-CM | POA: Diagnosis not present

## 2021-09-13 DIAGNOSIS — S62326D Displaced fracture of shaft of fifth metacarpal bone, right hand, subsequent encounter for fracture with routine healing: Secondary | ICD-10-CM | POA: Diagnosis not present

## 2021-09-13 DIAGNOSIS — S62324D Displaced fracture of shaft of fourth metacarpal bone, right hand, subsequent encounter for fracture with routine healing: Secondary | ICD-10-CM | POA: Diagnosis not present

## 2021-09-23 ENCOUNTER — Ambulatory Visit: Payer: BC Managed Care – PPO | Admitting: Cardiology

## 2021-09-23 ENCOUNTER — Encounter: Payer: Self-pay | Admitting: Cardiology

## 2021-09-23 ENCOUNTER — Other Ambulatory Visit: Payer: Self-pay

## 2021-09-23 VITALS — BP 157/68 | HR 71 | Temp 98.5°F | Resp 16 | Ht 63.5 in | Wt 166.4 lb

## 2021-09-23 DIAGNOSIS — I10 Essential (primary) hypertension: Secondary | ICD-10-CM | POA: Diagnosis not present

## 2021-09-23 DIAGNOSIS — I6522 Occlusion and stenosis of left carotid artery: Secondary | ICD-10-CM

## 2021-09-23 DIAGNOSIS — M940 Chondrocostal junction syndrome [Tietze]: Secondary | ICD-10-CM | POA: Diagnosis not present

## 2021-09-23 MED ORDER — ATENOLOL 25 MG PO TABS: 25.0000 mg | ORAL_TABLET | Freq: Every day | ORAL | 2 refills | Status: DC

## 2021-09-23 NOTE — Patient Instructions (Signed)
You have costochondritis.  Heat to tender area. Cold ice compressions to reduce inflammation. Recommended Aleve OTC 1-2 tablets BID for 3-4 days as needed.

## 2021-09-23 NOTE — Progress Notes (Signed)
Primary Physician/Referring:  Lacie Draft, NP  Patient ID: Suzanne Barton, female    DOB: 09/22/1950, 71 y.o.   MRN: [id number]  Chief Complaint  Patient presents with   Chest Pain   Follow-up   HPI:    Suzanne Barton  is a 71 y.o. Caucasian female patient with labile hypertension, hyperlipidemia, hepatitis C which has been cured, moderate left carotid stenosis, asymptomatic, mild obesity but has been losing weight over time, called our office today stating that she has been having chest pain.  We brought her in to be evaluated. She has noticed occasional palpitations but no chest pain.  No change in fatigue.  No leg edema, no PND or orthopnea.  Chest pain described as discomfort in the right side of the chest.  No other associated symptoms.  Past Medical History:  Diagnosis Date   Allergy    Anemia    Blood transfusion    Femur fracture, right ([place]) 02/10/2016   GERD (gastroesophageal reflux disease)    Hepatitis C 1992   in remission   Hyperlipidemia    no meds   Neuromuscular disorder (HCC)    hiatal hernia   Right patella fracture    Seasonal allergies    Past Surgical History:  Procedure Laterality Date   APPENDECTOMY     CHOLECYSTECTOMY     cyst removed     x3- under right arm, x3- groin area- as a teenager   FEMUR IM NAIL Right 02/10/2016   Procedure: INTRAMEDULLARY (IM) NAIL FEMORAL;  Surgeon: Marchia Bond, MD;  Location: [place];  Service: Orthopedics;  Laterality: Right;   FRACTURE SURGERY  1973   right femur   FRACTURE SURGERY     left thumb   knee arthoscopy     x3   LIVER BIOPSY     x12   ORIF PATELLA Right 03/07/2019   Procedure: OPEN REDUCTION INTERNAL (ORIF) FIXATION PATELLA;  Surgeon: Marchia Bond, MD;  Location: [place];  Service: Orthopedics;  Laterality: Right;   OVARIAN CYST SURGERY     PARTIAL HYSTERECTOMY     had partial first, then later had all female organs removed   SPINE SURGERY  11-13-1999   vertebrae fusion    TONSILLECTOMY AND ADENOIDECTOMY     TUBAL LIGATION     Family History  Problem Relation Age of Onset   Heart failure Father 40   Lung cancer Mother    Cancer Mother        lung and brain   COPD Sister    Lung cancer Sister    Fibromyalgia Sister    Colon cancer Neg Hx    Esophageal cancer Neg Hx    Stomach cancer Neg Hx    Rectal cancer Neg Hx     Social History   Tobacco Use   Smoking status: Never   Smokeless tobacco: Never   Tobacco comments:    husband smoked x 13 yrs  Substance Use Topics   Alcohol use: No   Marital Status: Married  ROS  Review of Systems  Cardiovascular:  Positive for chest pain, dyspnea on exertion (Stable) and palpitations. Negative for leg swelling.  Musculoskeletal:  Positive for back pain and joint pain.  Gastrointestinal:  Negative for melena.  Objective  Blood pressure (!) 157/68, pulse 71, temperature 98.5 F (36.9 C), temperature source Temporal, resp. rate 16, height 5' 3.5" (1.613 m), weight 166 lb 6.4 oz (75.5 kg), SpO2 97 %. Body mass index  is 29.01 kg/m.  Vitals with BMI 09/23/2021 06/16/2021 06/16/2021  Height 5' 3.5" - 5' 3.5"  Weight 166 lbs 6 oz - 178 lbs 10 oz  BMI 09.98 - 33.82  Systolic 505 397 673  Diastolic 68 57 67  Pulse 71 70 79     Physical Exam Constitutional:      Appearance: She is obese.  Neck:     Vascular: Carotid bruit (left) present. No JVD.  Cardiovascular:     Rate and Rhythm: Normal rate and regular rhythm.     Pulses: Normal pulses and intact distal pulses.     Heart sounds: Normal heart sounds. No murmur heard.   No gallop.  Pulmonary:     Effort: Pulmonary effort is normal.     Breath sounds: Normal breath sounds.  Abdominal:     General: Bowel sounds are normal.     Palpations: Abdomen is soft.  Musculoskeletal:        General: No swelling.    Laboratory examination:   No results for input(s): NA, K, CL, CO2, GLUCOSE, BUN, CREATININE, CALCIUM, GFRNONAA, GFRAA in the last 8760  hours. CrCl cannot be calculated (Patient's most recent lab result is older than the maximum 21 days allowed.).  CMP Latest Ref Rng & Units 02/12/2016 02/11/2016 02/10/2016  Glucose 65 - 99 mg/dL 86 131(H) 130(H)  BUN 6 - 20 mg/dL <5(L) 7 6  Creatinine 0.44 - 1.00 mg/dL 0.92 0.96 1.03(H)  Sodium 135 - 145 mmol/L 138 136 139  Potassium 3.5 - 5.1 mmol/L 4.1 4.3 3.8  Chloride 101 - 111 mmol/L 104 103 105  CO2 22 - 32 mmol/L 27 28 28   Calcium 8.9 - 10.3 mg/dL 8.5(L) 8.8(L) 9.1   CBC Latest Ref Rng & Units 02/13/2016 02/12/2016 02/11/2016  WBC 4.0 - 10.5 K/uL 5.5 5.4 8.9  Hemoglobin 12.0 - 15.0 g/dL 8.6(L) 8.5(L) 10.0(L)  Hematocrit 36.0 - 46.0 % 26.0(L) 26.6(L) 31.1(L)  Platelets 150 - 400 K/uL 155 144(L) 179   Lipid Panel Recent Labs    02/02/21 1426  CHOL 270*  TRIG 158*  LDLCALC 187*  HDL 54  LDLDIRECT 182*   Lipid Panel     Component Value Date/Time   CHOL 270 (H) 02/02/2021 1426   TRIG 158 (H) 02/02/2021 1426   HDL 54 02/02/2021 1426   LDLCALC 187 (H) 02/02/2021 1426   LDLDIRECT 182 (H) 02/02/2021 1426   LABVLDL 29 02/02/2021 1426    CBC, CMP normal recently by PCP, labs reviewed. No lipid panel. TSH normal HEMOGLOBIN A1C No results found for: HGBA1C, MPG TSH No results for input(s): TSH in the last 8760 hours.  External labs:   06/06/2021: BUN 19, creatinine 0.90, GFR >60, sodium 138, potassium 4.9  Hgb 13.4, HCT 40.6, MCV 91, platelet 183  Total cholesterol 147, triglycerides 68, HDL 75, LDL 58  TSH 4.19   Medications and allergies   Allergies  Allergen Reactions   Tussionex Pennkinetic Er [Hydrocod Polst-Cpm Polst Er] Hives   Amoxicillin Rash    2 GM CEFAZOLIN ADMINISTERED WITHOUT ANY DIFFICULTIES OR REACTIONS 02/10/2016 @ 1900   Ibuprofen Rash   Sulfa Antibiotics Rash    Medication prior to this encounter:   Outpatient Medications Prior to Visit  Medication Sig Dispense Refill   aspirin (ASPIRIN CHILDRENS) 81 MG chewable tablet Chew 1 tablet (81 mg total) by  mouth daily.     Carboxymethylcellulose Sodium (REFRESH TEARS OP) Apply 1-2 drops to eye as needed.  ezetimibe-simvastatin (VYTORIN) 10-40 MG tablet TAKE ONE TABLET BY MOUTH ONCE DAILY AT 6:00 P.M. 30 tablet 2   Vitamin D, Ergocalciferol, (DRISDOL) 1.25 MG (50000 UNIT) CAPS capsule Take 50,000 Units by mouth every 7 (seven) days.     benzonatate (TESSALON) 200 MG capsule Take 200 mg by mouth.     cephALEXin (KEFLEX) 500 MG capsule Take 500 mg by mouth 4 (four) times daily.     cetirizine (ZYRTEC) 10 MG chewable tablet Chew 10 mg by mouth daily.     montelukast (SINGULAIR) 10 MG tablet Take 10 mg by mouth daily.     NON FORMULARY Take 2 tablets by mouth daily at 6 (six) AM. Keto plus acv     Pseudoephedrine-Guaifenesin (MUCUS RELIEF D) [phone] MG TB12 Take by mouth as needed.     Vitamin D, Ergocalciferol, (DRISDOL) 1.25 MG (50000 UNIT) CAPS capsule Take 50,000 Units by mouth once a week.     No facility-administered medications prior to visit.    FINAL MEDICATION AS OF TODAY:   Current Meds  Medication Sig   aspirin (ASPIRIN CHILDRENS) 81 MG chewable tablet Chew 1 tablet (81 mg total) by mouth daily.   atenolol (TENORMIN) 25 MG tablet Take 1 tablet (25 mg total) by mouth daily.   Carboxymethylcellulose Sodium (REFRESH TEARS OP) Apply 1-2 drops to eye as needed.   ezetimibe-simvastatin (VYTORIN) 10-40 MG tablet TAKE ONE TABLET BY MOUTH ONCE DAILY AT 6:00 P.M.   Vitamin D, Ergocalciferol, (DRISDOL) 1.25 MG (50000 UNIT) CAPS capsule Take 50,000 Units by mouth every 7 (seven) days.   Radiology:   No results found.  Cardiac Studies:   Echocardiogram 02/03/2021: Normal LV systolic function with visual EF 60-65%. Left ventricle cavity is normal in size. Normal global wall motion. Normal diastolic filling pattern, normal LAP.  Mild (Grade I) aortic regurgitation. No prior study for comparison.  Lexiscan Tetrofosmin stress test 02/14/2021: 1 Day Rest/Stress Protocol. Stress EKG is  non-diagnostic, as this is pharmacological stress test using Lexiscan. Normal myocardial perfusion. No convincing evidence of reversible myocardial ischemia or prior infarct.   Left ventricular ejection fraction is 58% with normal wall motion.   No prior studies for comparison. Low risk study.  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 Stenosis in the right external carotid artery (<50%). Suggests stenosis in the left internal carotid artery (50-69%). Stenosis in the left carotid bifurcation of <50%. Stenosis in the left external carotid artery (<50%). There is homogeneous plaque throughout the bilateral carotid arteries. Antegrade right vertebral artery flow. Antegrade left vertebral artery flow. Follow up in six months is appropriate if clinically indicated.  EKG:   EKG 09/23/2021: Normal sinus rhythm at rate of 68 bpm, normal axis, poor R wave progression, cannot exclude anteroseptal infarct old.  Compared to 03/16/2021, possible old inferior infarct not present.  Otherwise no significant change.  Assessment     ICD-10-CM   1. Costochondritis  M94.0 EKG 12-Lead    2. Primary hypertension  I10 atenolol (TENORMIN) 25 MG tablet    3. Asymptomatic stenosis of left carotid artery  I65.22       Medications Discontinued During This Encounter  Medication Reason   cephALEXin (KEFLEX) 500 MG capsule    cetirizine (ZYRTEC) 10 MG chewable tablet    Vitamin D, Ergocalciferol, (DRISDOL) 1.25 MG (50000 UNIT) CAPS capsule    benzonatate (TESSALON) 200 MG capsule    NON FORMULARY    Pseudoephedrine-Guaifenesin (MUCUS RELIEF D) [phone] MG TB12  montelukast (SINGULAIR) 10 MG tablet      Meds ordered this encounter  Medications   atenolol (TENORMIN) 25 MG tablet    Sig: Take 1 tablet (25 mg total) by mouth daily.    Dispense:  30 tablet    Refill:  2    Orders Placed This Encounter  Procedures   EKG 12-Lead   Recommendations:    Halo Shevlin Barton is a 71 y.o. Caucasian female patient with labile hypertension, hyperlipidemia, hepatitis C which has been cured, moderate left carotid stenosis, asymptomatic, mild obesity but has been losing weight over time, called our office today stating that she has been having chest pain.  We brought her in to be evaluated.  Her chest pain symptoms are clearly costochondritis.  She has reproducible chest pain.  EKG reveals normal sinus rhythm.  Previously noted inferior wall Q waves no longer present and also previously she has had normal echocardiogram without wall motion abnormality and a normal stress test.  Advised her to use heat pads and cold pads and as needed Aleve.  For hypertension, I have started her on atenolol 25 mg daily.  We could also consider changing atenolol to atenolol/HCTZ combination if blood pressure is not well controlled on her next office visit.  She is now on a statin therapy, external labs reviewed, lipids under excellent control.  With regard to carotid stenosis she has carotid duplex pending.  I have reassured her regarding this.  Would like to see her back in 6 weeks for follow-up of hypertension and also discuss carotid artery duplex.  Patient felt reassured.   Adrian Prows, MD, [facility] 09/23/2021, 1:04 PM Office: [phone]

## 2021-09-26 ENCOUNTER — Other Ambulatory Visit: Payer: Self-pay | Admitting: Cardiology

## 2021-09-26 DIAGNOSIS — E7800 Pure hypercholesterolemia, unspecified: Secondary | ICD-10-CM

## 2021-10-05 ENCOUNTER — Other Ambulatory Visit: Payer: Self-pay

## 2021-10-05 ENCOUNTER — Ambulatory Visit: Payer: BC Managed Care – PPO

## 2021-10-05 DIAGNOSIS — I6523 Occlusion and stenosis of bilateral carotid arteries: Secondary | ICD-10-CM

## 2021-10-09 NOTE — Progress Notes (Signed)
She also has an appointment to see me 11/04/2021, and to see you on 12/15/2021, please confirm this, I do not need to see her unless she wants me to.

## 2021-10-12 NOTE — Progress Notes (Signed)
Called and spoke with patient regarding her CAD results. Patient wants to keep appointment on the 24th because she is feeling more tired than usual and thinks it may be related to her heart. Just FYI.

## 2021-10-25 DIAGNOSIS — S62326D Displaced fracture of shaft of fifth metacarpal bone, right hand, subsequent encounter for fracture with routine healing: Secondary | ICD-10-CM | POA: Diagnosis not present

## 2021-10-25 DIAGNOSIS — S62324D Displaced fracture of shaft of fourth metacarpal bone, right hand, subsequent encounter for fracture with routine healing: Secondary | ICD-10-CM | POA: Diagnosis not present

## 2021-10-26 DIAGNOSIS — M1711 Unilateral primary osteoarthritis, right knee: Secondary | ICD-10-CM | POA: Diagnosis not present

## 2021-10-26 DIAGNOSIS — M1611 Unilateral primary osteoarthritis, right hip: Secondary | ICD-10-CM | POA: Diagnosis not present

## 2021-11-04 ENCOUNTER — Other Ambulatory Visit: Payer: Self-pay

## 2021-11-04 ENCOUNTER — Ambulatory Visit: Payer: BC Managed Care – PPO | Admitting: Cardiology

## 2021-11-04 ENCOUNTER — Encounter: Payer: Self-pay | Admitting: Cardiology

## 2021-11-04 VITALS — BP 140/51 | HR 44 | Temp 97.3°F | Resp 17 | Ht 63.5 in | Wt 165.4 lb

## 2021-11-04 DIAGNOSIS — E7800 Pure hypercholesterolemia, unspecified: Secondary | ICD-10-CM

## 2021-11-04 DIAGNOSIS — I10 Essential (primary) hypertension: Secondary | ICD-10-CM | POA: Diagnosis not present

## 2021-11-04 DIAGNOSIS — I6523 Occlusion and stenosis of bilateral carotid arteries: Secondary | ICD-10-CM | POA: Diagnosis not present

## 2021-11-04 MED ORDER — LOSARTAN POTASSIUM-HCTZ 50-12.5 MG PO TABS: 1.0000 | ORAL_TABLET | Freq: Every morning | ORAL | 3 refills | Status: DC

## 2021-11-04 NOTE — Progress Notes (Signed)
Primary Physician/Referring:  Lacie Draft, NP  Patient ID: Suzanne Barton, female    DOB: 1951/09/10, 71 y.o.   MRN: [id number]  Chief Complaint  Patient presents with   Follow-up    6 weeks   Hypertension   carotid duplex   HPI:    Suzanne Barton  is a 71 y.o. Caucasian female patient with hypertension, hyperlipidemia, hepatitis C which has been cured, moderate bilateral carotid stenosis, asymptomatic, mild obesity but has been losing weight over time, presents for f/u of above. No chest pain.  No change in mild fatigue, but improving over time  No leg edema, no PND or orthopnea.   Past Medical History:  Diagnosis Date   Allergy    Anemia    Blood transfusion    Femur fracture, right ([place]) 02/10/2016   GERD (gastroesophageal reflux disease)    Hepatitis C 1992   in remission   Hyperlipidemia    no meds   Neuromuscular disorder (HCC)    hiatal hernia   Right patella fracture    Seasonal allergies    Social History   Tobacco Use   Smoking status: Never   Smokeless tobacco: Never   Tobacco comments:    husband smoked x 13 yrs  Substance Use Topics   Alcohol use: No   Marital Status: Married  ROS  Review of Systems  Cardiovascular:  Negative for chest pain, dyspnea on exertion and leg swelling.  Objective  Blood pressure (!) 140/51, pulse (!) 44, temperature (!) 97.3 F (36.3 C), temperature source Temporal, resp. rate 17, height 5' 3.5" (1.613 m), weight 165 lb 6.4 oz (75 kg), SpO2 97 %. Body mass index is 28.84 kg/m.  Vitals with BMI 11/04/2021 09/23/2021 06/16/2021  Height 5' 3.5" 5' 3.5" -  Weight 165 lbs 6 oz 166 lbs 6 oz -  BMI 88.89 16.94 -  Systolic 503 888 280  Diastolic 51 68 57  Pulse 44 71 70     Physical Exam Neck:     Vascular: Carotid bruit (Left) present. No JVD.  Cardiovascular:     Rate and Rhythm: Normal rate and regular rhythm.     Pulses: Intact distal pulses.     Heart sounds: Normal heart sounds. No murmur heard.   No  gallop.  Pulmonary:     Effort: Pulmonary effort is normal.     Breath sounds: Normal breath sounds.  Abdominal:     General: Bowel sounds are normal.     Palpations: Abdomen is soft.  Musculoskeletal:     Right lower leg: No edema.     Left lower leg: No edema.    Laboratory examination:   External labs:   06/06/2021: BUN 19, creatinine 0.90, GFR >60, sodium 138, potassium 4.9  Hgb 13.4, HCT 40.6, MCV 91, platelet 183  Total cholesterol 147, triglycerides 68, HDL 75, LDL 58  TSH 4.19   Medications and allergies   Allergies  Allergen Reactions   Tussionex Pennkinetic Er [Hydrocod Poli-Chlorphe Poli Er] Hives   Amoxicillin Rash    2 GM CEFAZOLIN ADMINISTERED WITHOUT ANY DIFFICULTIES OR REACTIONS 02/10/2016 @ 1900   Ibuprofen Rash   Sulfa Antibiotics Rash    FINAL MEDICATION AS OF TODAY:    Current Outpatient Medications:    aspirin (ASPIRIN CHILDRENS) 81 MG chewable tablet, Chew 1 tablet (81 mg total) by mouth daily., Disp: , Rfl:    atenolol (TENORMIN) 25 MG tablet, Take 1 tablet (25 mg total) by mouth daily.,  Disp: 30 tablet, Rfl: 2   benzonatate (TESSALON) 200 MG capsule, Take 200 mg by mouth 3 (three) times daily., Disp: , Rfl:    Carboxymethylcellulose Sodium (REFRESH TEARS OP), Apply 1-2 drops to eye as needed., Disp: , Rfl:    ezetimibe-simvastatin (VYTORIN) 10-40 MG tablet, TAKE ONE TABLET BY MOUTH ONCE DAILY AT 6:00 P.M., Disp: 90 tablet, Rfl: 2   losartan-hydrochlorothiazide (HYZAAR) 50-12.5 MG tablet, Take 1 tablet by mouth every morning., Disp: 90 tablet, Rfl: 3   Vitamin D, Ergocalciferol, (DRISDOL) 1.25 MG (50000 UNIT) CAPS capsule, Take 50,000 Units by mouth every 7 (seven) days., Disp: , Rfl:    Radiology:   X-ray ribs 07/15/2021: No fracture or other bone lesions are seen involving the ribs. There is no evidence of pneumothorax or pleural effusion. Both lungs are clear. Heart size and mediastinal contours are within normal limits.  Cardiac Studies:    Echocardiogram 02/03/2021: Normal LV systolic function with visual EF 60-65%. Left ventricle cavity is normal in size. Normal global wall motion. Normal diastolic filling pattern, normal LAP.  Mild (Grade I) aortic regurgitation. No prior study for comparison.  Lexiscan Tetrofosmin stress test 02/14/2021: 1 Day Rest/Stress Protocol. Stress EKG is non-diagnostic, as this is pharmacological stress test using Lexiscan. Normal myocardial perfusion. No convincing evidence of reversible myocardial ischemia or prior infarct.   Left ventricular ejection fraction is 58% with normal wall motion.   No prior studies for comparison. Low risk study.  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 Stenosis in the right external carotid artery (<50%). Suggests stenosis in the left internal carotid artery (50-69%). Stenosis in the left carotid bifurcation of <50%. Stenosis in the left external carotid artery (<50%). There is homogeneous plaque throughout the bilateral carotid arteries. Antegrade right vertebral artery flow. Antegrade left vertebral artery flow. Follow up in six months is appropriate if clinically indicated.  EKG:   EKG 09/23/2021: Normal sinus rhythm at rate of 68 bpm, normal axis, poor R wave progression, cannot exclude anteroseptal infarct old.  Compared to 03/16/2021, possible old inferior infarct not present.  Otherwise no significant change.  Assessment     ICD-10-CM   1. Primary hypertension  I10 losartan-hydrochlorothiazide (HYZAAR) 50-12.5 MG tablet    2. Asymptomatic bilateral carotid artery stenosis  I65.23     3. Hypercholesteremia  E78.00       There are no discontinued medications.    Meds ordered this encounter  Medications   losartan-hydrochlorothiazide (HYZAAR) 50-12.5 MG tablet    Sig: Take 1 tablet by mouth every morning.    Dispense:  90 tablet    Refill:  3    No orders of the defined types were placed in  this encounter.  Recommendations:   Suzanne Barton is a 71 y.o. Caucasian female patient with hypertension, hyperlipidemia, hepatitis C which has been cured, moderate bilateral carotid stenosis, asymptomatic, mild obesity but has been losing weight over time, presents for f/u of above.  Remains asymptomatic.  No change in physical exam, no clinical evidence of heart failure.  Blood pressure is elevated, her blood pressure has been also elevated at home at >130 mmHg.  She will be best served with addition of an ARB, will start her on losartan HCT 50/12.5 mg in the morning.  She has complete physical examination and also lab draw scheduled for 11/28/2021 with her PCP and it would be appropriate at that time to check the renal function and potassium levels.  She  will fax [place] the reports.  Carotid artery duplex reviewed, stable and no significant change.  We will rescan her in 6 months.  Lipids are well controlled.  She continues to lose weight.  Overall I am very pleased with her progress.  I will see her back in a year or sooner if problems.    Adrian Prows, MD, [facility] 11/04/2021, 9:29 AM Office: [phone]

## 2021-12-02 ENCOUNTER — Telehealth: Payer: Self-pay

## 2021-12-02 DIAGNOSIS — I10 Essential (primary) hypertension: Secondary | ICD-10-CM

## 2021-12-02 MED ORDER — SPIRONOLACTONE 25 MG PO TABS: 25.0000 mg | ORAL_TABLET | ORAL | 2 refills | Status: DC

## 2021-12-02 NOTE — Telephone Encounter (Signed)
I called the patient and discussed with therapy, patient developed mild shortness of breath and also generalized rash throughout the body after she started taking losartan, she did tolerate this for almost a month. ? ?We will switch her to spironolactone 25 mg daily.  She has an appointment coming up soon with me.  Patient feels well, states that she is already started feeling better and presently not having any shortness of breath and is stable. ? ? ?Adrian Prows, MD, [facility] ?12/02/2021, 3:51 PM ?Office: [phone] ?Fax: [phone] ?Pager: [phone]  ? ?  ICD-10-CM   ?1. Primary hypertension  I10 spironolactone (ALDACTONE) 25 MG tablet  ?  ? ? ?Medications Discontinued During This Encounter  ?Medication Reason  ? losartan-hydrochlorothiazide (HYZAAR) 50-12.5 MG tablet Side effect (s)  ?  ?Meds ordered this encounter  ?Medications  ? spironolactone (ALDACTONE) 25 MG tablet  ?  Sig: Take 1 tablet (25 mg total) by mouth every morning.  ?  Dispense:  30 tablet  ?  Refill:  2  ?  ?

## 2021-12-02 NOTE — Telephone Encounter (Signed)
Pt called and stated that she woke up yesterday morning with a slight rash, this morning it has gotten worse and it is all over her body. Throat feels scratchy. Advised pt to stop the lisinopril-hydrochlorothiazide and to take Alphonzo Severance this today and tomorrow.  ?

## 2021-12-06 DIAGNOSIS — S62326D Displaced fracture of shaft of fifth metacarpal bone, right hand, subsequent encounter for fracture with routine healing: Secondary | ICD-10-CM | POA: Diagnosis not present

## 2021-12-06 DIAGNOSIS — S62324D Displaced fracture of shaft of fourth metacarpal bone, right hand, subsequent encounter for fracture with routine healing: Secondary | ICD-10-CM | POA: Diagnosis not present

## 2021-12-11 IMAGING — US US RENAL
1 series · 14 of 25 positions shown · non-contrast
Comparison: None available.

CLINICAL DATA: Initial evaluation for hematuria.

EXAM:
RENAL / URINARY TRACT ULTRASOUND COMPLETE

[Series 1: us renal · 0.26mm/px · 14 of 35 slices shown]
[im 1/35]
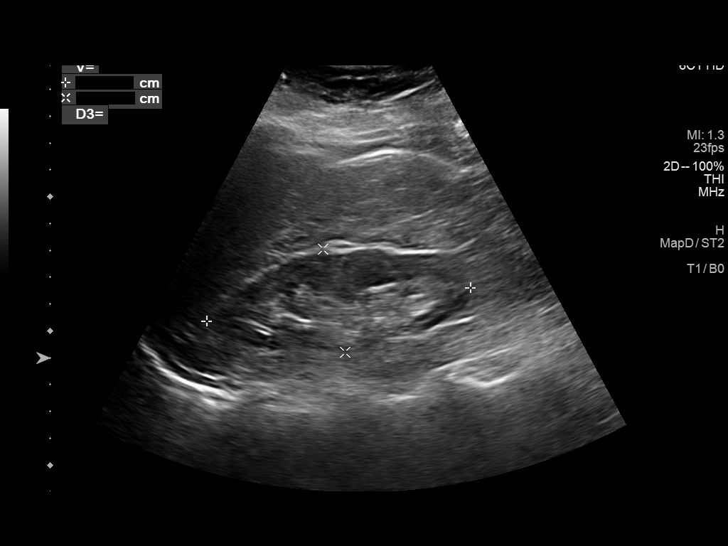
[im 3/35]
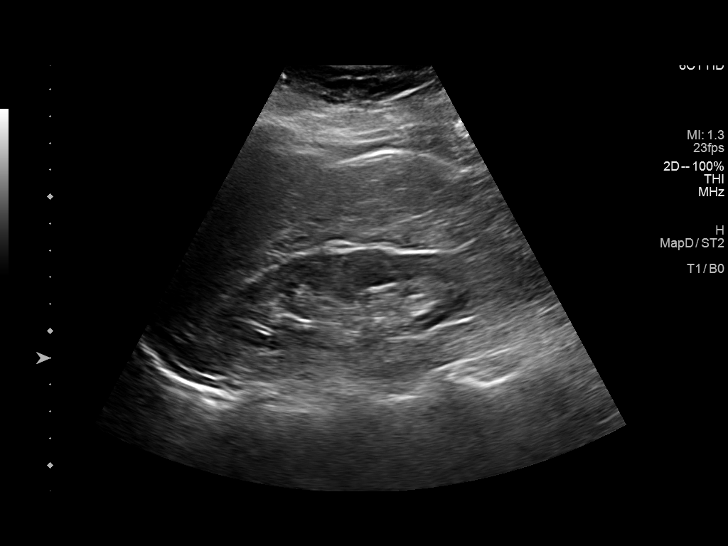
[im 6/35]
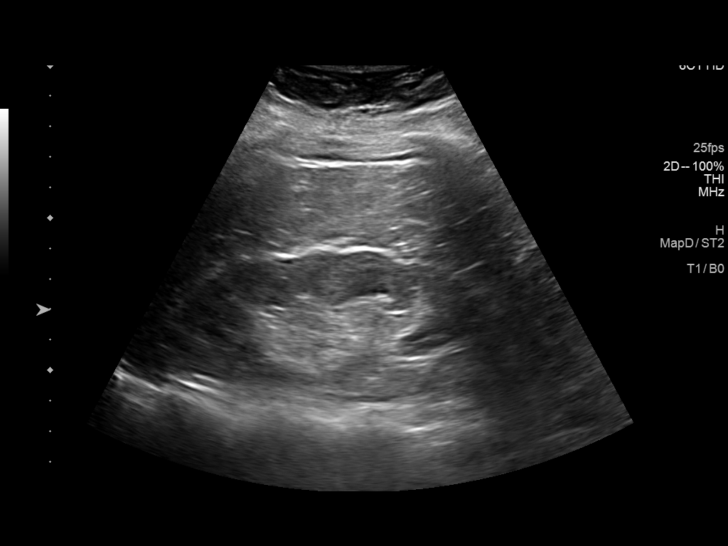
[im 9/35]
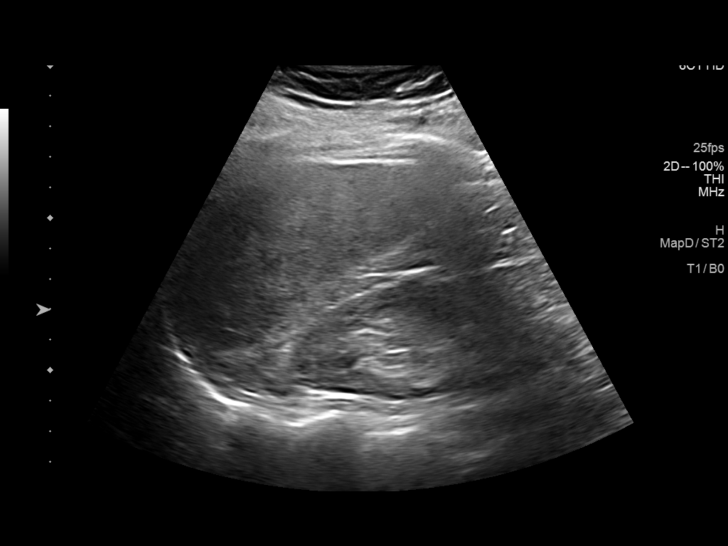
[im 12/35]
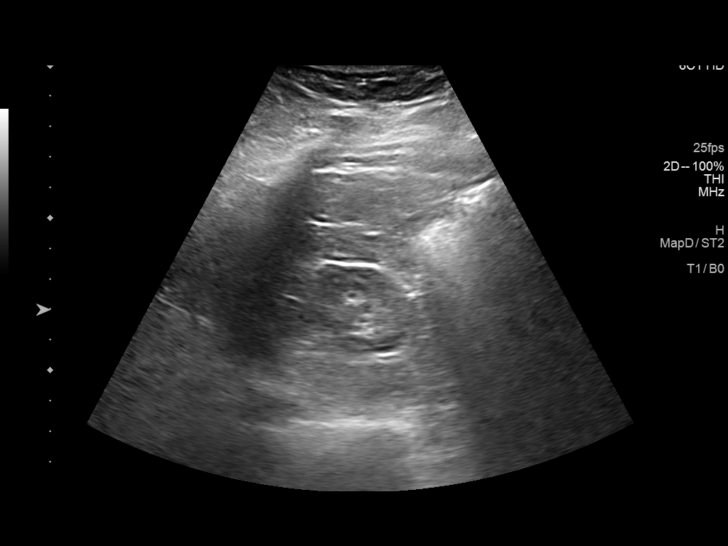
[im 13/35]
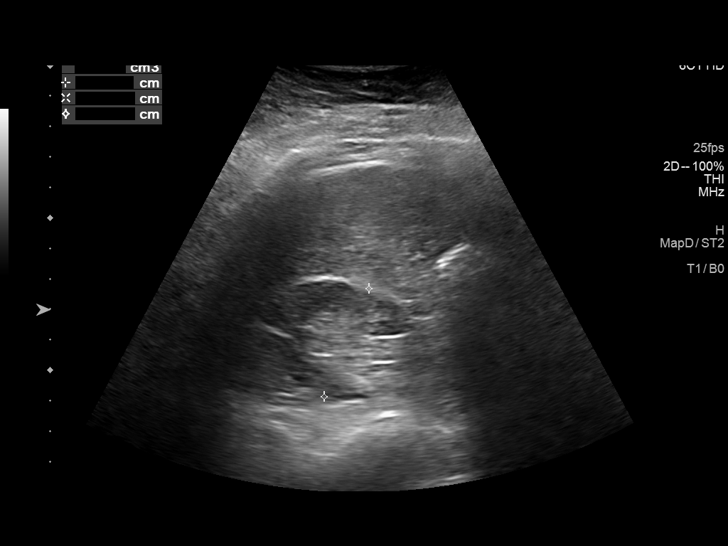
[im 16/35]
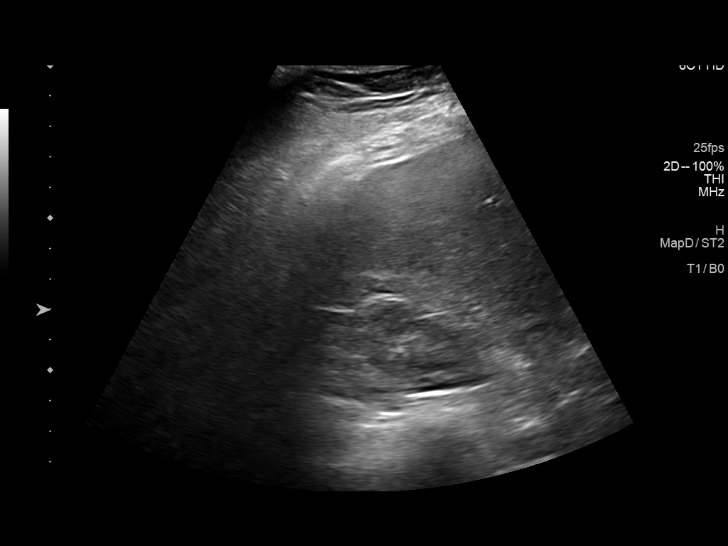
[im 19/35]
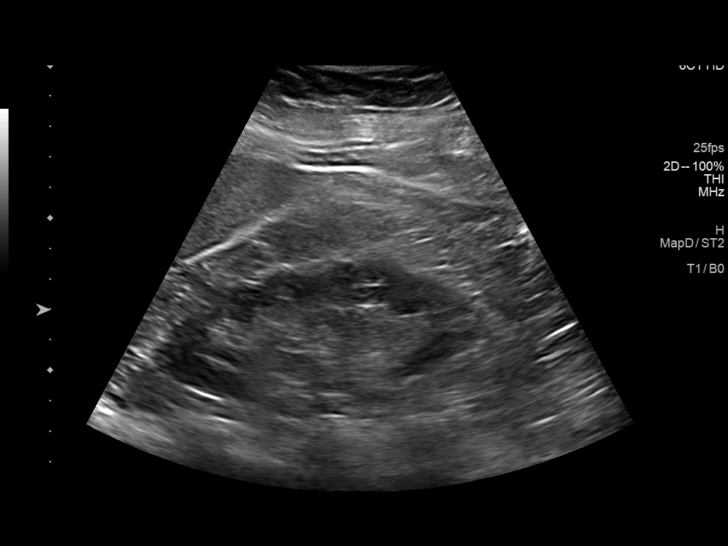
[im 22/35]
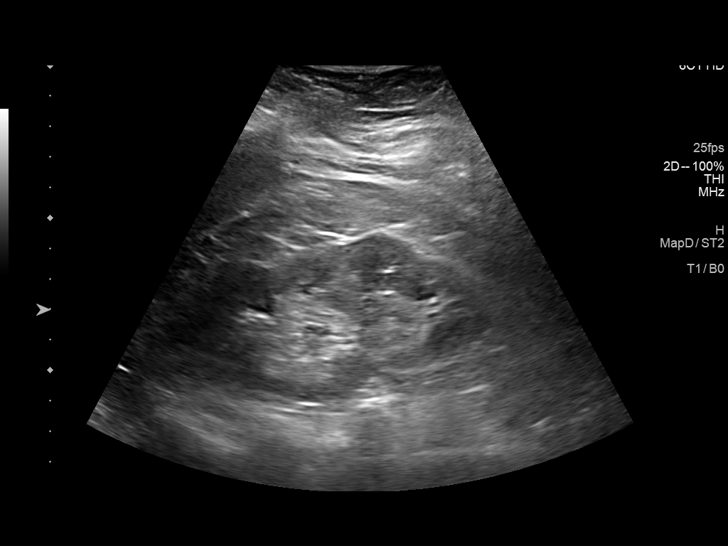
[im 23/35]
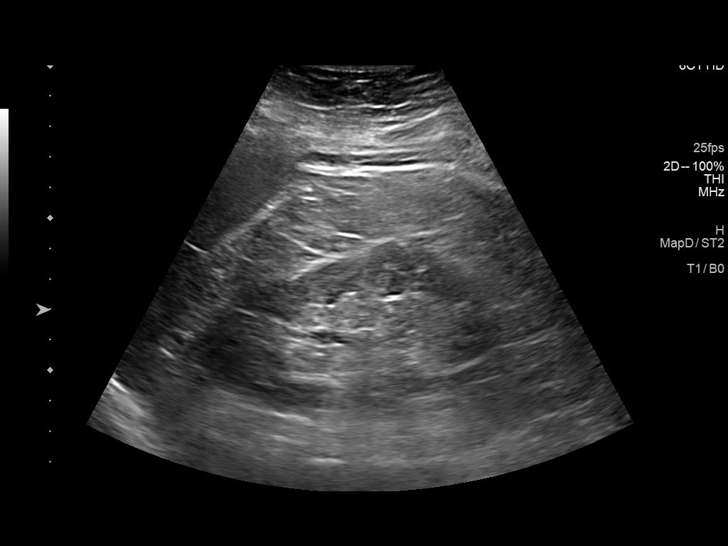
[im 26/35]
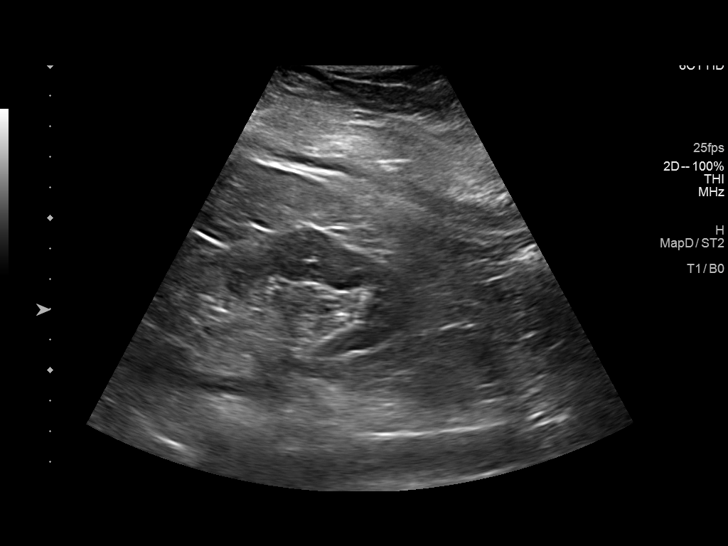
[im 29/35]
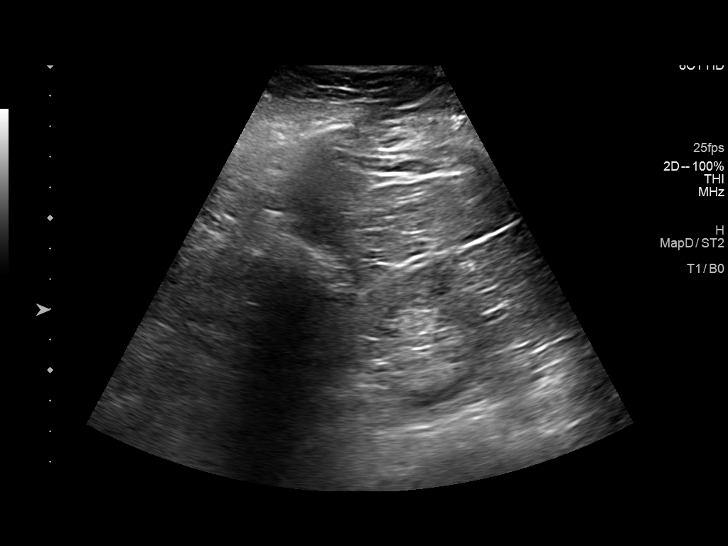
[im 32/35]
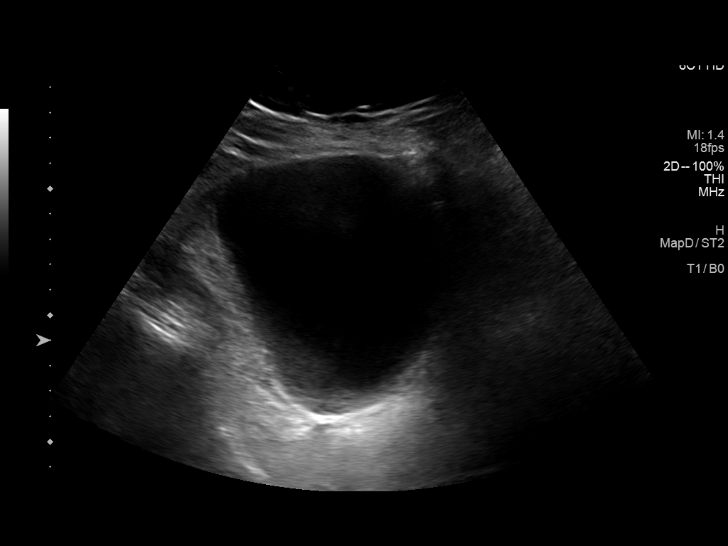
[im 35/35]
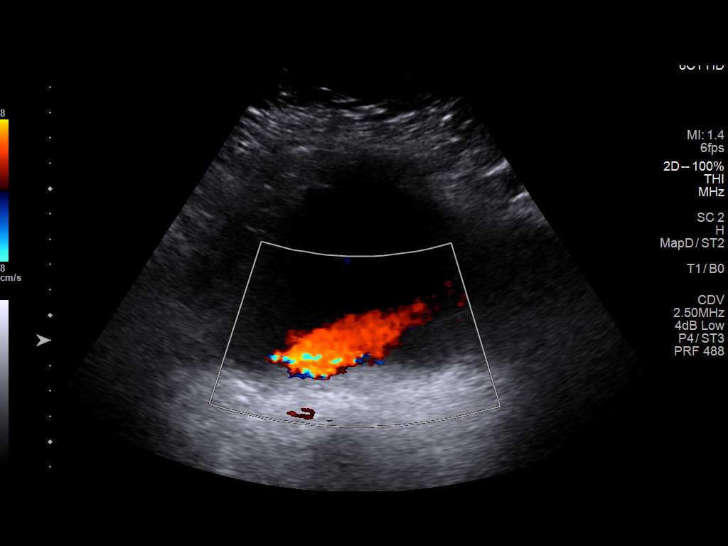

[14 of 25 positions shown; findings below may reference images not displayed]

FINDINGS: Right Kidney:

Renal measurements: 9.9 x 3.9 x 3.8 cm = volume: 70.3 mL.
Echogenicity within normal limits. No nephrolithiasis or
hydronephrosis. No focal renal mass.

Left Kidney:

Renal measurements: 11.0 x 4.6 x 4.8 cm = volume: 127.0 mL.
Echogenicity within normal limits. No nephrolithiasis or
hydronephrosis. No focal renal mass.

Bladder:

Appears normal for degree of bladder distention. Bilateral jets are
visualized.

Other:

None.
IMPRESSION: Normal renal ultrasound. No nephrolithiasis or obstructive uropathy.
No findings to explain patient's symptoms identified.

## 2021-12-13 DIAGNOSIS — M79645 Pain in left finger(s): Secondary | ICD-10-CM | POA: Diagnosis not present

## 2021-12-13 DIAGNOSIS — M79641 Pain in right hand: Secondary | ICD-10-CM | POA: Diagnosis not present

## 2021-12-13 DIAGNOSIS — M25562 Pain in left knee: Secondary | ICD-10-CM | POA: Diagnosis not present

## 2021-12-13 DIAGNOSIS — M25561 Pain in right knee: Secondary | ICD-10-CM | POA: Diagnosis not present

## 2021-12-13 DIAGNOSIS — M25551 Pain in right hip: Secondary | ICD-10-CM | POA: Diagnosis not present

## 2021-12-14 NOTE — Progress Notes (Signed)
? ?Primary Physician/Referring:  Debbora Lacrosse, FNP ? ?Patient ID: Suzanne Barton, female    DOB: 1951/07/18, 71 y.o.   MRN: [id number] ? ?Chief Complaint  ?Patient presents with  ? Hypertension  ? carotid stenosis  ? Follow-up  ?  6 month  ? ?HPI:   ? ?Suzanne Barton  is a 71 y.o. 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  ? ?Patient was last seen 10/31/2021 by Dr. Einar Gip at which time given uncontrolled hypertension was started on losartan/hydrochlorothiazide.  Patient subsequently called the office with concern of rash after starting this medication.  Dr. Einar Gip discontinued losartan/hydrochlorothiazide and switch her to spironolactone.  Patient now presents for follow-up of hypertension.  Patient does not monitor her blood pressure at home.  Rash has resolved since stopping losartan/hydrochlorothiazide.  She did not have repeat BMP done since switching to spironolactone, however she is tolerating this well. ? ?Past Medical History:  ?Diagnosis Date  ? Allergy   ? Anemia   ? Blood transfusion   ? Femur fracture, right ([place]) 02/10/2016  ? GERD (gastroesophageal reflux disease)   ? Hepatitis C 1992  ? in remission  ? Hyperlipidemia   ? no meds  ? Neuromuscular disorder ([place])   ? hiatal hernia  ? Right patella fracture   ? Seasonal allergies   ? ?Social History  ? ?Tobacco Use  ? Smoking status: Never  ? Smokeless tobacco: Never  ? Tobacco comments:  ?  husband smoked x 13 yrs  ?Substance Use Topics  ? Alcohol use: No  ? ?Marital Status: Married  ?ROS  ?Review of Systems  ?Cardiovascular:  Negative for chest pain, dyspnea on exertion and leg swelling.  ? ?Objective  ?Blood pressure (!) 148/58, pulse (!) 58, temperature 98 ?F (36.7 ?C), resp. rate 16, height '5\' 3"'$  (1.6 m), weight 163 lb (73.9 kg), SpO2 99 %. Body mass index is 28.87 kg/m?.  ? ?   12/15/2021  ?  2:13 PM 12/15/2021  ?  2:02 PM 11/04/2021  ?  8:54 AM  ?Vitals with BMI  ?Height  '5\' 3"'$  5' 3.5"  ?Weight  163 lbs 165 lbs 6 oz  ?BMI  28.88 28.84  ?Systolic 696 789 381  ?Diastolic 58 66 51  ?Pulse 58 64 44  ?  ? Physical Exam ?Neck:  ?   Vascular: Carotid bruit (Left) present. No JVD.  ?Cardiovascular:  ?   Rate and Rhythm: Normal rate and regular rhythm.  ?   Pulses: Intact distal pulses.  ?   Heart sounds: Normal heart sounds. No murmur heard. ?  No gallop.  ?Pulmonary:  ?   Effort: Pulmonary effort is normal.  ?   Breath sounds: Normal breath sounds.  ?Abdominal:  ?   General: Bowel sounds are normal.  ?   Palpations: Abdomen is soft.  ?Musculoskeletal:  ?   Right lower leg: No edema.  ?   Left lower leg: No edema.  ?Physical exam unchanged compared to previous office visit. ? ?Laboratory examination:  ? ?External labs:  ? ?06/06/2021: ?BUN 19, creatinine 0.90, GFR >60, sodium 138, potassium 4.9 ? ?Hgb 13.4, HCT 40.6, MCV 91, platelet 183 ? ?Total cholesterol 147, triglycerides 68, HDL 75, LDL 58 ? ?TSH 4.19 ?  ?Allergies  ? ?Allergies  ?Allergen Reactions  ? Losartan Potassium-Hctz Shortness Of Breath  ?  Rash  ? Tussionex  Pennkinetic Er [Hydrocod Poli-Chlorphe Poli Er] Hives  ? Amoxicillin Rash  ?  2 GM CEFAZOLIN ADMINISTERED WITHOUT ANY DIFFICULTIES OR REACTIONS 02/10/2016 @ 1900  ? Ibuprofen Rash  ? Sulfa Antibiotics Rash  ?  ?FINAL MEDICATION AS OF TODAY:  ? ? ?Current Outpatient Medications:  ?  aspirin (ASPIRIN CHILDRENS) 81 MG chewable tablet, Chew 1 tablet (81 mg total) by mouth daily., Disp: , Rfl:  ?  atenolol (TENORMIN) 25 MG tablet, Take 1 tablet (25 mg total) by mouth daily., Disp: 30 tablet, Rfl: 2 ?  benzonatate (TESSALON) 200 MG capsule, Take 200 mg by mouth 3 (three) times daily., Disp: , Rfl:  ?  Carboxymethylcellulose Sodium (REFRESH TEARS OP), Apply 1-2 drops to eye as needed., Disp: , Rfl:  ?  ezetimibe-simvastatin (VYTORIN) 10-40 MG tablet, TAKE ONE TABLET BY MOUTH ONCE DAILY AT  6:00 P.M., Disp: 90 tablet, Rfl: 2 ?  montelukast (SINGULAIR) 10 MG tablet, Take 10 mg by mouth at bedtime., Disp: , Rfl:  ?  Nutritional Supplements (KETO PO), Take 1 tablet by mouth every other day. Plus ACV, Disp: , Rfl:  ?  spironolactone (ALDACTONE) 25 MG tablet, Take 1 tablet (25 mg total) by mouth every morning., Disp: 30 tablet, Rfl: 2 ?  Vitamin D, Ergocalciferol, (DRISDOL) 1.25 MG (50000 UNIT) CAPS capsule, Take 50,000 Units by mouth every 7 (seven) days., Disp: , Rfl:   ? ?Radiology:  ? ?X-ray ribs 07/15/2021: ?No fracture or other bone lesions are seen involving the ribs. There ?is no evidence of pneumothorax or pleural effusion. Both lungs are ?clear. Heart size and mediastinal contours are within normal limits. ? ?Cardiac Studies:  ? ?Echocardiogram 02/03/2021: ?Normal LV systolic function with visual EF 60-65%. Left ventricle cavity is normal in size. Normal global wall motion. Normal diastolic filling pattern, normal LAP.  ?Mild (Grade I) aortic regurgitation. ?No prior study for comparison. ? ?Lexiscan Tetrofosmin stress test 02/14/2021: ?1 Day Rest/Stress Protocol. ?Stress EKG is non-diagnostic, as this is pharmacological stress test using Lexiscan. ?Normal myocardial perfusion. No convincing evidence of reversible myocardial ischemia or prior infarct.   ?Left ventricular ejection fraction is 58% with normal wall motion.   ?No prior studies for comparison. ?Low risk study. ? ?Carotid artery duplex 02/15/2021: ?Stenosis in the right internal carotid artery (50-69%). Stenosis in the right external carotid artery (<50%). ?Suggests stenosis in the left internal carotid artery (50-69%). Stenosis in the left carotid bifurcation of <50%. Stenosis in the left external carotid artery (<50%). ?There is homogeneous plaque throughout the bilateral carotid arteries. ?Antegrade right vertebral artery flow. Antegrade left vertebral artery flow. ?Follow up in six months is appropriate if clinically  indicated. ? ?EKG:  ? ?EKG 09/23/2021: Normal sinus rhythm at rate of 68 bpm, normal axis, poor R wave progression, cannot exclude anteroseptal infarct old.  Compared to 03/16/2021, possible old inferior infarct not present.  Otherwise no significant change. ? ?Assessment  ? ?  ICD-10-CM   ?1. Primary hypertension  I10 EKG 12-Lead  ?  Basic metabolic panel  ?  ?  ?There are no discontinued medications. ? ?  ?No orders of the defined types were placed in this encounter. ? ? ?Orders Placed This Encounter  ?Procedures  ? Basic metabolic panel  ? EKG 12-Lead  ? ?Recommendations:  ? ?Suzanne Barton is a 71 y.o. 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  ? ?Patient  was last seen 10/31/2021 by Dr. Einar Gip at which time given uncontrolled hypertension was started on losartan/hydrochlorothiazide.  Patient subsequently called the office with concern of rash after starting this medication.  Dr. Einar Gip discontinued losartan/hydrochlorothiazide and switch her to spironolactone.  Patient now presents for follow-up of hypertension.  Patient is tolerating spironolactone without issue.  She will have repeat BMP done at PCPs office and results sent for review.  Patient's blood pressure is elevated in the office today unfortunately there are no home readings to evaluate.  Patient feels she has a component of whitecoat hypertension as she gets anxious each time she comes to the office.  Have encouraged patient to monitor blood pressure regularly at home and keep a written log.  Following repeat BMP will consider rechallenging ARB therapy if renal function allows given continued uncontrolled hypertension.  However patient will provide [place] with home monitoring readings at that time. ? ?Patient is scheduled for carotid artery surveillance in June 2023.  We will plan to follow-up to  reevaluate hypertension following carotid artery duplex in 3 months. ? ? ?Alethia Berthold, PA-C ?12/15/2021, 3:44 PM ?Office: [phone] ?

## 2021-12-15 ENCOUNTER — Ambulatory Visit: Payer: BC Managed Care – PPO | Admitting: Student

## 2021-12-15 ENCOUNTER — Encounter: Payer: Self-pay | Admitting: Student

## 2021-12-15 VITALS — BP 148/58 | HR 58 | Temp 98.0°F | Resp 16 | Ht 63.0 in | Wt 163.0 lb

## 2021-12-15 DIAGNOSIS — I10 Essential (primary) hypertension: Secondary | ICD-10-CM

## 2022-01-02 ENCOUNTER — Other Ambulatory Visit: Payer: Self-pay | Admitting: Cardiology

## 2022-01-02 DIAGNOSIS — I10 Essential (primary) hypertension: Secondary | ICD-10-CM

## 2022-01-25 DIAGNOSIS — M1612 Unilateral primary osteoarthritis, left hip: Secondary | ICD-10-CM | POA: Diagnosis not present

## 2022-01-25 DIAGNOSIS — M25562 Pain in left knee: Secondary | ICD-10-CM | POA: Diagnosis not present

## 2022-01-31 DIAGNOSIS — R059 Cough, unspecified: Secondary | ICD-10-CM | POA: Diagnosis not present

## 2022-03-07 ENCOUNTER — Ambulatory Visit: Payer: BC Managed Care – PPO

## 2022-03-07 DIAGNOSIS — I6523 Occlusion and stenosis of bilateral carotid arteries: Secondary | ICD-10-CM | POA: Diagnosis not present

## 2022-03-08 ENCOUNTER — Other Ambulatory Visit: Payer: Self-pay | Admitting: Cardiology

## 2022-03-08 DIAGNOSIS — I10 Essential (primary) hypertension: Secondary | ICD-10-CM

## 2022-03-15 NOTE — Progress Notes (Unsigned)
Primary Physician/Referring:  Debbora Lacrosse, FNP  Patient ID: Suzanne Barton, female    DOB: [birth year], 71 y.o.   MRN: [id number]  No chief complaint on file.  HPI:    Suzanne Barton  is a 71 y.o. 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   Patient was last seen in the office 12/15/2021 at which time held off on rechallenging ARB therapy due to renal function.  Since then repeat BMP was within normal limits.  Carotid artery stenosis remains stable, will plan for repeat check in 6 months.  Patient now presents for 78-monthfollow-up. ***  ***  Patient was last seen 10/31/2021 by Dr. GEinar Gipat which time given uncontrolled hypertension was started on losartan/hydrochlorothiazide.  Patient subsequently called the office with concern of rash after starting this medication.  Dr. GEinar Gipdiscontinued losartan/hydrochlorothiazide and switch her to spironolactone.  Patient now presents for follow-up of hypertension.  Patient does not monitor her blood pressure at home.  Rash has resolved since stopping losartan/hydrochlorothiazide.  She did not have repeat BMP done since switching to spironolactone, however she is tolerating this well.  Past Medical History:  Diagnosis Date   Allergy    Anemia    Blood transfusion    Femur fracture, right ([place] 02/10/2016   GERD (gastroesophageal reflux disease)    Hepatitis C 1992   in remission   Hyperlipidemia    no meds   Neuromuscular disorder ([facility])    hiatal hernia   Right patella fracture    Seasonal allergies    Social History   Tobacco Use   Smoking status: Never   Smokeless tobacco: Never   Tobacco comments:    husband smoked x 13 yrs  Substance Use Topics   Alcohol use: No   Marital Status: Married  ROS  Review of Systems  Cardiovascular:  Negative for chest pain,  dyspnea on exertion and leg swelling.    Objective  There were no vitals taken for this visit. There is no height or weight on file to calculate BMI.     12/15/2021    2:13 PM 12/15/2021    2:02 PM 11/04/2021    8:54 AM  Vitals with BMI  Height  '5\' 3"'$  5' 3.5"  Weight  163 lbs 165 lbs 6 oz  BMI  210.93223.55 Systolic [id number] Diastolic 58 66 51  Pulse 58 64 44     Physical Exam Neck:     Vascular: Carotid bruit (Left) present. No JVD.  Cardiovascular:     Rate and Rhythm: Normal rate and regular rhythm.     Pulses: Intact distal pulses.     Heart sounds: Normal heart sounds. No murmur heard.    No gallop.  Pulmonary:     Effort: Pulmonary effort is normal.     Breath sounds: Normal breath sounds.  Abdominal:     General: Bowel sounds are normal.     Palpations: Abdomen is soft.  Musculoskeletal:     Right lower leg: No edema.     Left lower leg: No edema.   Physical exam unchanged compared to previous office visit.  Laboratory examination:   External labs:   06/06/2021: BUN 19, creatinine 0.90, GFR >60, sodium 138, potassium 4.9  Hgb 13.4, HCT 40.6, MCV 91, platelet 183  Total cholesterol 147, triglycerides 68, HDL 75, LDL 58  TSH 4.19   Allergies   Allergies  Allergen Reactions   Losartan Potassium-Hctz Shortness Of Breath    Rash   Tussionex Pennkinetic Er [Hydrocod Poli-Chlorphe Poli Er] Hives   Amoxicillin Rash    2 GM CEFAZOLIN ADMINISTERED WITHOUT ANY DIFFICULTIES OR REACTIONS 02/10/2016 @ 1900   Ibuprofen Rash   Sulfa Antibiotics Rash    FINAL MEDICATION AS OF TODAY:    Current Outpatient Medications:    aspirin (ASPIRIN CHILDRENS) 81 MG chewable tablet, Chew 1 tablet (81 mg total) by mouth daily., Disp: , Rfl:    atenolol (TENORMIN) 25 MG tablet, TAKE ONE TABLET BY MOUTH ONCE DAILY, Disp: 30 tablet, Rfl: 2   benzonatate (TESSALON) 200 MG capsule, Take 200 mg by mouth 3 (three) times daily., Disp: , Rfl:    Carboxymethylcellulose Sodium (REFRESH  TEARS OP), Apply 1-2 drops to eye as needed., Disp: , Rfl:    ezetimibe-simvastatin (VYTORIN) 10-40 MG tablet, TAKE ONE TABLET BY MOUTH ONCE DAILY AT 6:00 P.M., Disp: 90 tablet, Rfl: 2   montelukast (SINGULAIR) 10 MG tablet, Take 10 mg by mouth at bedtime., Disp: , Rfl:    Nutritional Supplements (KETO PO), Take 1 tablet by mouth every other day. Plus ACV, Disp: , Rfl:    spironolactone (ALDACTONE) 25 MG tablet, TAKE 1 TABLET BY MOUTH IN THE MORNING, Disp: 30 tablet, Rfl: 2   Vitamin D, Ergocalciferol, (DRISDOL) 1.25 MG (50000 UNIT) CAPS capsule, Take 50,000 Units by mouth every 7 (seven) days., Disp: , Rfl:    Radiology:   X-ray ribs 07/15/2021: No fracture or other bone lesions are seen involving the ribs. There is no evidence of pneumothorax or pleural effusion. Both lungs are clear. Heart size and mediastinal contours are within normal limits.  Cardiac Studies:   Echocardiogram 02/03/2021: Normal LV systolic function with visual EF 60-65%. Left ventricle cavity is normal in size. Normal global wall motion. Normal diastolic filling pattern, normal LAP.  Mild (Grade I) aortic regurgitation. No prior study for comparison.  Lexiscan Tetrofosmin stress test 02/14/2021: 1 Day Rest/Stress Protocol. Stress EKG is non-diagnostic, as this is pharmacological stress test using Lexiscan. Normal myocardial perfusion. No convincing evidence of reversible myocardial ischemia or prior infarct.   Left ventricular ejection fraction is 58% with normal wall motion.   No prior studies for comparison. Low risk study.  Carotid artery duplex 03/06/2022:  Duplex suggests stenosis in the right internal carotid artery (50-69%).  Duplex suggests stenosis in the left internal carotid artery (50-69%).  Antegrade right vertebral artery flow. Antegrade left vertebral artery flow.  No significant change from 10/05/2021. Follow up in six months is  appropriate if clinically indicated.  EKG:  ***   EKG  09/23/2021: Normal sinus rhythm at rate of 68 bpm, normal axis, poor R wave progression, cannot exclude anteroseptal infarct old.  Compared to 03/16/2021, possible old inferior infarct not present.  Otherwise no significant change.  Assessment   No diagnosis found.   There are no discontinued medications.    No orders of the defined types were placed in this encounter.   No orders of the defined types were placed in this encounter.  Recommendations:   Suzanne Barton is a 71 y.o. 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   Patient was last seen in the office 12/15/2021 at which time held off on rechallenging ARB therapy due to renal function.  Since then repeat BMP was within normal limits.  Carotid artery stenosis remains stable, will plan for repeat check in 6 months.  Patient now presents for 29-monthfollow-up. ***  ***  Patient was last seen 10/31/2021 by Dr. GEinar Gipat which time given uncontrolled hypertension was started on losartan/hydrochlorothiazide.  Patient subsequently called the office with concern of rash after starting this medication.  Dr. GEinar Gipdiscontinued losartan/hydrochlorothiazide and switch her to spironolactone.  Patient now presents for follow-up of hypertension.  Patient is tolerating spironolactone without issue.  She will have repeat BMP done at PCPs office and results sent for review.  Patient's blood pressure is elevated in the office today unfortunately there are no home readings to evaluate.  Patient feels she has a component of whitecoat hypertension as she gets anxious each time she comes to the office.  Have encouraged patient to monitor blood pressure regularly at home and keep a written log.  Following repeat BMP will consider rechallenging ARB therapy if renal function allows given continued uncontrolled  hypertension.  However patient will [place] at that time.  Patient is scheduled for carotid artery surveillance in June 2023.  We will plan to follow-up to reevaluate hypertension following carotid artery duplex in 3 months.   CAlethia Berthold PA-C 03/15/2022, 10:10 AM Office: [phone]

## 2022-03-16 ENCOUNTER — Encounter: Payer: Self-pay | Admitting: Student

## 2022-03-16 ENCOUNTER — Ambulatory Visit: Payer: BC Managed Care – PPO | Admitting: Student

## 2022-03-16 VITALS — BP 138/60 | HR 46 | Temp 97.7°F | Resp 16 | Ht 63.0 in | Wt 174.0 lb

## 2022-03-16 DIAGNOSIS — I10 Essential (primary) hypertension: Secondary | ICD-10-CM | POA: Diagnosis not present

## 2022-03-16 DIAGNOSIS — I6523 Occlusion and stenosis of bilateral carotid arteries: Secondary | ICD-10-CM

## 2022-03-16 DIAGNOSIS — R001 Bradycardia, unspecified: Secondary | ICD-10-CM

## 2022-03-16 MED ORDER — ATENOLOL 25 MG PO TABS: 12.5000 mg | ORAL_TABLET | Freq: Every day | ORAL | 2 refills | Status: DC

## 2022-03-30 ENCOUNTER — Ambulatory Visit: Payer: BC Managed Care – PPO | Admitting: Student

## 2022-03-30 ENCOUNTER — Encounter: Payer: Self-pay | Admitting: Student

## 2022-03-30 ENCOUNTER — Encounter: Payer: Self-pay | Admitting: Internal Medicine

## 2022-03-30 VITALS — BP 137/83 | HR 60 | Temp 98.0°F | Resp 16 | Ht 63.0 in | Wt 176.0 lb

## 2022-03-30 DIAGNOSIS — I10 Essential (primary) hypertension: Secondary | ICD-10-CM

## 2022-03-30 DIAGNOSIS — R001 Bradycardia, unspecified: Secondary | ICD-10-CM | POA: Diagnosis not present

## 2022-03-30 NOTE — Progress Notes (Signed)
Primary Physician/Referring:  Debbora Lacrosse, FNP  Patient ID: Suzanne Barton, female    DOB: August 30, 1951, 71 y.o.   MRN: [id number]  Chief Complaint  Patient presents with   Bradycardia   Fatigue   Follow-up    2 week   HPI:    Suzanne Barton  is a 71 y.o. 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   Patient was last seen in the office 03/16/2022 with fatigue and marked sinus bradycardia.  Therefore reduced atenolol from 25 mg to 12.5 mg, she now presents for 2-week follow-up and repeat EKG. today's EKG reveals patient's heart rate has improved to 57 bpm.  However she states fatigue is unchanged.  Patient does note that she does not sleep well throughout the night.   Past Medical History:  Diagnosis Date   Allergy    Anemia    Blood transfusion    Femur fracture, right ([place]) 02/10/2016   GERD (gastroesophageal reflux disease)    Hepatitis C 1992   in remission   Hyperlipidemia    no meds   Neuromuscular disorder (HCC)    hiatal hernia   Right patella fracture    Seasonal allergies    Social History   Tobacco Use   Smoking status: Never   Smokeless tobacco: Never   Tobacco comments:    husband smoked x 13 yrs  Substance Use Topics   Alcohol use: No   Marital Status: Married  ROS  Review of Systems  Constitutional: Positive for malaise/fatigue.  Cardiovascular:  Negative for chest pain, dyspnea on exertion, leg swelling, near-syncope, orthopnea, palpitations, paroxysmal nocturnal dyspnea and syncope.  Neurological:  Negative for dizziness.    Objective  Blood pressure 137/83, pulse 60, temperature 98 F (36.7 C), resp. rate 16, height '5\' 3"'$  (1.6 m), weight 176 lb (79.8 kg), SpO2 97 %. Body mass index is 31.18 kg/m.     03/30/2022    9:27 AM 03/16/2022    8:49 AM 12/15/2021    2:13 PM  Vitals with  BMI  Height '5\' 3"'$  '5\' 3"'$    Weight 176 lbs 174 lbs   BMI 47.65 46.50   Systolic 354 656 812  Diastolic 83 60 58  Pulse 60 46 58     Physical Exam Neck:     Vascular: Carotid bruit (Left) present. No JVD.  Cardiovascular:     Rate and Rhythm: Normal rate and regular rhythm.     Pulses: Intact distal pulses.     Heart sounds: Normal heart sounds. No murmur heard.    No gallop.  Pulmonary:     Effort: Pulmonary effort is normal.     Breath sounds: Normal breath sounds.  Abdominal:     General: Bowel sounds are normal.     Palpations: Abdomen is soft.  Musculoskeletal:     Right lower leg: No edema.     Left lower leg: No edema.   Physical exam unchanged compared to previous office visit.  Laboratory examination:   External labs:  02/28/2022: BUN 21, creatinine 0.93, GFR >60, sodium 138, potassium 4.6, AST 17, ALT 19  06/06/2021: BUN 19, creatinine 0.90, GFR >60, sodium 138, potassium 4.9  Hgb 13.4, HCT 40.6, MCV 91, platelet 183  Total cholesterol 147, triglycerides 68, HDL 75, LDL 58  TSH 4.19   Allergies   Allergies  Allergen Reactions   Losartan  Potassium-Hctz Shortness Of Breath    Rash   Tussionex Pennkinetic Er [Hydrocod Poli-Chlorphe Poli Er] Hives   Amoxicillin Rash    2 GM CEFAZOLIN ADMINISTERED WITHOUT ANY DIFFICULTIES OR REACTIONS 02/10/2016 @ 1900   Ibuprofen Rash   Sulfa Antibiotics Rash    FINAL MEDICATION AS OF TODAY:    Current Outpatient Medications:    Acetaminophen (CHLORASEPTIC SORE THROAT PO), Take by mouth., Disp: , Rfl:    aspirin (ASPIRIN CHILDRENS) 81 MG chewable tablet, Chew 1 tablet (81 mg total) by mouth daily., Disp: , Rfl:    atenolol (TENORMIN) 25 MG tablet, Take 0.5 tablets (12.5 mg total) by mouth daily., Disp: 30 tablet, Rfl: 2   Carboxymethylcellulose Sodium (REFRESH TEARS OP), Apply 1-2 drops to eye as needed., Disp: , Rfl:    cetirizine (ZYRTEC) 10 MG tablet, Take 10 mg by mouth daily., Disp: , Rfl:    diphenhydrAMINE  (BENADRYL) 25 mg capsule, Take 25 mg by mouth every 6 (six) hours as needed., Disp: , Rfl:    ezetimibe-simvastatin (VYTORIN) 10-40 MG tablet, TAKE ONE TABLET BY MOUTH ONCE DAILY AT 6:00 P.M., Disp: 90 tablet, Rfl: 2   montelukast (SINGULAIR) 10 MG tablet, Take 10 mg by mouth at bedtime., Disp: , Rfl:    Nutritional Supplements (KETO PO), Take 1 tablet by mouth every other day. Plus ACV, Disp: , Rfl:    pantoprazole (PROTONIX) 40 MG tablet, Take 40 mg by mouth daily., Disp: , Rfl:    Phenyleph-Doxylamine-DM-APAP (ALKA-SELTZER PLUS COLD & FLU) 10-12.5-20-650 MG PACK, Take by mouth., Disp: , Rfl:    spironolactone (ALDACTONE) 25 MG tablet, TAKE 1 TABLET BY MOUTH IN THE MORNING, Disp: 30 tablet, Rfl: 2   Vitamin D, Ergocalciferol, (DRISDOL) 1.25 MG (50000 UNIT) CAPS capsule, Take 50,000 Units by mouth every 7 (seven) days., Disp: , Rfl:    Radiology:   X-ray ribs 07/15/2021: No fracture or other bone lesions are seen involving the ribs. There is no evidence of pneumothorax or pleural effusion. Both lungs are clear. Heart size and mediastinal contours are within normal limits.  Cardiac Studies:   Echocardiogram 02/03/2021: Normal LV systolic function with visual EF 60-65%. Left ventricle cavity is normal in size. Normal global wall motion. Normal diastolic filling pattern, normal LAP.  Mild (Grade I) aortic regurgitation. No prior study for comparison.  Lexiscan Tetrofosmin stress test 02/14/2021: 1 Day Rest/Stress Protocol. Stress EKG is non-diagnostic, as this is pharmacological stress test using Lexiscan. Normal myocardial perfusion. No convincing evidence of reversible myocardial ischemia or prior infarct.   Left ventricular ejection fraction is 58% with normal wall motion.   No prior studies for comparison. Low risk study.  Carotid artery duplex 03/06/2022:  Duplex suggests stenosis in the right internal carotid artery (50-69%).  Duplex suggests stenosis in the left internal carotid  artery (50-69%).  Antegrade right vertebral artery flow. Antegrade left vertebral artery flow.  No significant change from 10/05/2021. Follow up in six months is  appropriate if clinically indicated.  EKG:  03/30/2022: Sinus rhythm at a rate of 57 bpm.  03/16/2022: Sinus bradycardia at a rate of 48 bpm.  Normal axis.  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  EKG 09/23/2021: Normal sinus rhythm at rate of 68 bpm, normal axis, poor R wave progression, cannot exclude anteroseptal infarct old.  Compared to 03/16/2021, possible old inferior infarct not present.  Otherwise no significant change.  Assessment     ICD-10-CM   1. Sinus bradycardia  R00.1 EKG 12-Lead  2. Primary hypertension  I10       Medications Discontinued During This Encounter  Medication Reason   benzonatate (TESSALON) 200 MG capsule     No orders of the defined types were placed in this encounter.  Orders Placed This Encounter  Procedures   EKG 12-Lead   Recommendations:   Aileene Lanum Atteberry is a 71 y.o. 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   Patient was last seen in the office 03/16/2022 with fatigue and marked sinus bradycardia.  Therefore reduced atenolol from 25 mg to 12.5 mg, she now presents for 2-week follow-up and repeat EKG. bradycardia has improved with reduced dose of atenolol, EKG reveals normal sinus rhythm at a rate of 57 bpm today.  Blood pressure remains well controlled.  However patient continues to have fatigue which she states is unchanged compared to last office visit.  She has had a recent echocardiogram and stress test, approximately 1 year ago.  Discussed with patient option of repeating this cardiac testing, however she wishes to hold off at this time.  We will therefore hold atenolol for 2 weeks and reevaluate fatigue.  If fatigue  improves patient can discontinue atenolol and continue to monitor blood pressure closely, however if fatigue remains unchanged she will resume 1/2 tablet of atenolol daily. Patient will notify our office in 2 weeks of symptoms.   She will also follow up with PCP for evaluation of non-cardiac causes of fatigue, including will have labs done with PCP. Would recommend TSH and CBC.   Follow up in 6 months, sooner if needed.    Alethia Berthold, PA-C 03/30/2022, 10:16 AM Office: [phone]

## 2022-04-05 ENCOUNTER — Telehealth: Payer: Self-pay | Admitting: *Deleted

## 2022-04-05 NOTE — Chronic Care Management (AMB) (Signed)
  Care Management   Outreach Note  04/05/2022 Name: Suzanne Barton MRN: [id number] DOB: Jan 15, 1951  An unsuccessful telephone outreach was attempted today. The patient was referred to the case management team for assistance with care management and care coordination.   Follow Up Plan:  A HIPAA compliant phone message was left for the patient providing contact information and requesting a return call.  The care management team will reach out to the patient again over the next 7 days.  If patient returns call to provider office, please advise to call [facility]* at [phone].*  [place]  Direct Dial: [phone]

## 2022-04-07 DIAGNOSIS — M898X5 Other specified disorders of bone, thigh: Secondary | ICD-10-CM | POA: Diagnosis not present

## 2022-04-07 DIAGNOSIS — M25552 Pain in left hip: Secondary | ICD-10-CM | POA: Diagnosis not present

## 2022-04-13 NOTE — Chronic Care Management (AMB) (Signed)
  Care Coordination   Note   04/13/2022 Name: JACQUALYNN PARCO MRN: [id number] DOB: [birth year]  Leonie Man Niebuhr is a 71 y.o. year old female who sees Talbott, Brennan Bailey, FNP for primary care. I reached out to [employer] by phone today to offer care coordination services.  Ms. Galyon was given information about Care Coordination services today including:   The Care Coordination services include support from the care team which includes your Nurse Coordinator, Clinical Social Worker, or Pharmacist.  The Care Coordination team is here to help remove barriers to the health concerns and goals most important to you. Care Coordination services are voluntary, and the patient may decline or stop services at any time by request to their care team member.   Care Coordination Consent Status: Patient agreed to services and verbal consent obtained.   Follow up plan:  Telephone appointment with care coordination team member scheduled for:  04/13/22  Encounter Outcome:  Pt. Scheduled  [place]  Direct Dial: [phone]

## 2022-04-14 ENCOUNTER — Ambulatory Visit: Payer: Self-pay

## 2022-04-14 NOTE — Patient Outreach (Signed)
  Care Coordination   Initial Visit Note   04/14/2022 Name: Suzanne Barton MRN: [id number] DOB: April 23, 1951  Suzanne Barton is a 71 y.o. year old female who sees Talbott, Brennan Bailey, FNP for primary care. I spoke with  Suzanne Barton by phone today  What matters to the patients health and wellness today?  Unable to speak: requesteed return call   SDOH assessments and interventions completed:  No     Care Coordination Interventions Activated:  No  Care Coordination Interventions:  No, not indicated   Follow up plan: Follow up call scheduled for 04/19/22 230 pm    Encounter Outcome:  Pt. Request to Call Back   Lazaro Arms RN, BSN, [facility]   Phone: [phone]

## 2022-04-19 ENCOUNTER — Ambulatory Visit: Payer: Self-pay

## 2022-04-19 NOTE — Patient Outreach (Signed)
  Care Coordination   Initial Visit Note   04/19/2022 Name: Suzanne Barton MRN: [id number] DOB: 04/20/1951  Suzanne Barton is a 70 y.o. year old female who sees Talbott, Brennan Bailey, FNP for primary care. I spoke with  Suzanne Barton by phone today  What matters to the patients health and wellness today?  I need a primary care Physician.    Goals Addressed               This Visit's Progress     " I would like a primary care physician" (pt-stated)        Care Coordination Interventions: Evaluation of current treatment plan related to patient's adherence to plan as established by provider Reviewed medications with patient and discussed adherence Discussed fall precautions The patient stated that she would like to find her and her husband a primary care physician.I emailed the patient a number to help her to her emailed she provided [email or web address] [phone]. I spoke with the patient regarding their AWV; She stated that she has had these in the past and will schedule it next month with Doylene Canard I will forward the necessary information to the office.         SDOH assessments and interventions completed:  Yes  SDOH Interventions Today    Flowsheet Row Most Recent Value  SDOH Interventions   Food Insecurity Interventions Intervention Not Indicated  Housing Interventions Intervention Not Indicated  Transportation Interventions Intervention Not Indicated        Care Coordination Interventions Activated:  Yes  Care Coordination Interventions:  Yes, provided   Follow up plan: No further intervention required.   Encounter Outcome:  Pt. Visit Completed   Lazaro Arms RN, BSN, [facility]   Phone: [phone]

## 2022-04-19 NOTE — Patient Instructions (Signed)
Visit Information  Thank you for taking time to visit with me today. Please don't hesitate to contact me if I can be of assistance to you.   Following are the goals we discussed today:   Goals Addressed               This Visit's Progress     " I would like a primary care physician" (pt-stated)        Care Coordination Interventions: Evaluation of current treatment plan related to patient's adherence to plan as established by provider Reviewed medications with patient and discussed adherence Discussed fall precautions The patient stated that she would like to find her and her husband a primary care physician.I emailed the patient a number to help her to her emailed she provided [email or web address] [phone]. I spoke with the patient regarding their AWV; She stated that she has had these in the past and will schedule it next month with Doylene Canard I will forward the necessary information to the office.          Patient verbalizes understanding of instructions and care plan provided today and agrees to view in [place]. Active MyChart status and patient understanding of how to access instructions and care plan via MyChart confirmed with patient.     Lazaro Arms RN, BSN, [facility]   Phone: [phone]

## 2022-04-21 DIAGNOSIS — M1612 Unilateral primary osteoarthritis, left hip: Secondary | ICD-10-CM | POA: Diagnosis not present

## 2022-04-26 ENCOUNTER — Ambulatory Visit (AMBULATORY_SURGERY_CENTER): Payer: Self-pay | Admitting: *Deleted

## 2022-04-26 VITALS — Ht 62.25 in | Wt 176.4 lb

## 2022-04-26 DIAGNOSIS — Z1211 Encounter for screening for malignant neoplasm of colon: Secondary | ICD-10-CM

## 2022-04-26 MED ORDER — NA SULFATE-K SULFATE-MG SULF 17.5-3.13-1.6 GM/177ML PO SOLN: 1.0000 | Freq: Once | ORAL | 0 refills | Status: AC

## 2022-04-26 NOTE — Progress Notes (Signed)
No egg or soy allergy known to patient  No issues known to pt with past sedation with any surgeries or procedures Patient denies ever being told they had issues or difficulty with intubation  No FH of Malignant Hyperthermia Pt is not on diet pills Pt is not on home 02  Pt is not on blood thinners  Pt denies issues with constipation  No A fib or A flutter Have any cardiac testing pending--NO Pt instructed to use Single[employer].com or GoodRx for a price reduction on prep   

## 2022-04-27 ENCOUNTER — Encounter: Payer: Self-pay | Admitting: Cardiology

## 2022-05-08 ENCOUNTER — Encounter: Payer: Self-pay | Admitting: Internal Medicine

## 2022-05-18 DIAGNOSIS — Z1231 Encounter for screening mammogram for malignant neoplasm of breast: Secondary | ICD-10-CM | POA: Diagnosis not present

## 2022-05-23 ENCOUNTER — Ambulatory Visit (AMBULATORY_SURGERY_CENTER): Payer: BC Managed Care – PPO | Admitting: Internal Medicine

## 2022-05-23 ENCOUNTER — Encounter: Payer: Self-pay | Admitting: Internal Medicine

## 2022-05-23 VITALS — BP 122/65 | HR 77 | Temp 97.5°F | Resp 16 | Ht 62.5 in | Wt 176.4 lb

## 2022-05-23 DIAGNOSIS — Z1211 Encounter for screening for malignant neoplasm of colon: Secondary | ICD-10-CM | POA: Diagnosis not present

## 2022-05-23 DIAGNOSIS — K635 Polyp of colon: Secondary | ICD-10-CM

## 2022-05-23 DIAGNOSIS — D120 Benign neoplasm of cecum: Secondary | ICD-10-CM

## 2022-05-23 HISTORY — PX: COLONOSCOPY: SHX174

## 2022-05-23 MED ORDER — SODIUM CHLORIDE 0.9 % IV SOLN: 500.0000 mL | INTRAVENOUS | Status: DC

## 2022-05-23 NOTE — Patient Instructions (Signed)
-  Handout on polyps provided   - Repeat colonoscopy in 7-10 years for surveillance.  - Patient has a contact number available for emergencies. The signs and symptoms of potential delayed complications were discussed with the patient. Return to normal activities tomorrow. Written discharge instructions were provided to the patient.  - Resume previous diet.  - Continue present medications.  - Await pathology results.  YOU HAD AN ENDOSCOPIC PROCEDURE TODAY AT [facility]:   Refer to the procedure report that was given to you for any specific questions about what was found during the examination.  If the procedure report does not answer your questions, please call your gastroenterologist to clarify.  If you requested that your care partner not be given the details of your procedure findings, then the procedure report has been included in a sealed envelope for you to review at your convenience later.  YOU SHOULD EXPECT: Some feelings of bloating in the abdomen. Passage of more gas than usual.  Walking can help get rid of the air that was put into your GI tract during the procedure and reduce the bloating. If you had a lower endoscopy (such as a colonoscopy or flexible sigmoidoscopy) you may notice spotting of blood in your stool or on the toilet paper. If you underwent a bowel prep for your procedure, you may not have a normal bowel movement for a few days.  Please Note:  You might notice some irritation and congestion in your nose or some drainage.  This is from the oxygen used during your procedure.  There is no need for concern and it should clear up in a day or so.  SYMPTOMS TO REPORT IMMEDIATELY:  Following lower endoscopy (colonoscopy or flexible sigmoidoscopy):  Excessive amounts of blood in the stool  Significant tenderness or worsening of abdominal pains  Swelling of the abdomen that is new, acute  Fever of 100F or higher   For urgent or emergent issues, a  gastroenterologist can be reached at any hour by calling [phone]. Do not use MyChart messaging for urgent concerns.    DIET:  We do recommend a small meal at first, but then you may proceed to your regular diet.  Drink plenty of fluids but you should avoid alcoholic beverages for 24 hours.  ACTIVITY:  You should plan to take it easy for the rest of today and you should NOT DRIVE or use heavy machinery until tomorrow (because of the sedation medicines used during the test).    FOLLOW UP: Our staff will call the number listed on your records the next business day following your procedure.  We will call around 7:15- 8:00 am to check on you and address any questions or concerns that you may have regarding the information given to you following your procedure. If we do not reach you, we will leave a message.     If any biopsies were taken you will be contacted by phone or by letter within the next 1-3 weeks.  Please call us at [phone] if you have not heard about the biopsies in 3 weeks.    SIGNATURES/CONFIDENTIALITY: You and/or your care partner have signed paperwork which will be entered into your electronic medical record.  These signatures attest to the fact that that the information above on your After Visit Summary has been reviewed and is understood.  Full responsibility of the confidentiality of this discharge information lies with you and/or your care-partner.

## 2022-05-23 NOTE — Progress Notes (Signed)
Called to room to assist during endoscopic procedure.  Patient ID and intended procedure confirmed with present staff. Received instructions for my participation in the procedure from the performing physician.  

## 2022-05-23 NOTE — Op Note (Signed)
[facility] Patient Name: Suzanne Barton Procedure Date: 05/23/2022 9:02 AM MRN: [id number] Endoscopist: Docia Chuck. Henrene Pastor , MD Age: 71 Referring MD:  Date of Birth: November 11, 1950 Gender: Female Account #: [id number] Procedure:                Colonoscopy with cold snare polypectomy x 1 Indications:              Screening for colorectal malignant neoplasm.                            Previous examination 2013 was negative for neoplasia Medicines:                Monitored Anesthesia Care Procedure:                Pre-Anesthesia Assessment:                           - Prior to the procedure, a History and Physical                            was performed, and patient medications and                            allergies were reviewed. The patient's tolerance of                            previous anesthesia was also reviewed. The risks                            and benefits of the procedure and the sedation                            options and risks were discussed with the patient.                            All questions were answered, and informed consent                            was obtained. Prior Anticoagulants: The patient has                            taken no previous anticoagulant or antiplatelet                            agents. After reviewing the risks and benefits, the                            patient was deemed in satisfactory condition to                            undergo the procedure.                           After obtaining informed consent, the colonoscope  was passed under direct vision. Throughout the                            procedure, the patient's blood pressure, pulse, and                            oxygen saturations were monitored continuously. The                            Olympus CF-HQ190L ([id number]) Colonoscope was                            introduced through the anus and advanced to the the                             cecum, identified by appendiceal orifice and                            ileocecal valve. The ileocecal valve, appendiceal                            orifice, and rectum were photographed. The quality                            of the bowel preparation was excellent. The                            colonoscopy was performed without difficulty. The                            patient tolerated the procedure well. The bowel                            preparation used was SUPREP via split dose                            instruction. Scope In: 9:15:33 AM Scope Out: 9:34:31 AM Scope Withdrawal Time: 0 hours 11 minutes 34 seconds  Total Procedure Duration: 0 hours 18 minutes 58 seconds  Findings:                 A 2 mm polyp was found in the cecum. The polyp was                            removed with a cold snare. Resection and retrieval                            were complete.                           The exam was otherwise without abnormality on                            direct and retroflexion views. Complications:  No immediate complications. Estimated blood loss:                            None. Estimated Blood Loss:     Estimated blood loss: none. Impression:               - One 2 mm polyp in the cecum, removed with a cold                            snare. Resected and retrieved.                           - The examination was otherwise normal on direct                            and retroflexion views. Recommendation:           - Repeat colonoscopy in 7-10 years for surveillance.                           - Patient has a contact number available for                            emergencies. The signs and symptoms of potential                            delayed complications were discussed with the                            patient. Return to normal activities tomorrow.                            Written discharge instructions were provided to the                             patient.                           - Resume previous diet.                           - Continue present medications.                           - Await pathology results. Docia Chuck. Henrene Pastor, MD 05/23/2022 9:39:29 AM This report has been signed electronically.

## 2022-05-23 NOTE — Progress Notes (Signed)
Pt's states no medical or surgical changes since previsit or office visit. 

## 2022-05-23 NOTE — Progress Notes (Signed)
HISTORY OF PRESENT ILLNESS:  Suzanne Barton is a 71 y.o. female who presents today for repeat screening colonoscopy.  Index exam in 2013 revealed only hyperplastic polyps.  No complaints  REVIEW OF SYSTEMS:  All non-GI ROS negative. Past Medical History:  Diagnosis Date   Allergy    Anemia    Arthritis    HIPS,KNEES   Blood transfusion    X 2   Femur fracture, right ([place]) 02/10/2016   GERD (gastroesophageal reflux disease)    Hepatitis C 1992   in remission   Hyperlipidemia    no meds   Hypertension    Neuromuscular disorder (HCC)    hiatal hernia   Right patella fracture    Seasonal allergies    Thyroid disease    "MILD"    Past Surgical History:  Procedure Laterality Date   APPENDECTOMY     CHOLECYSTECTOMY     cyst removed     x3- under right arm, x3- groin area- as a teenager   FEMUR IM NAIL Right 02/10/2016   Procedure: INTRAMEDULLARY (IM) NAIL FEMORAL;  Surgeon: Marchia Bond, MD;  Location: [facility];  Service: Orthopedics;  Laterality: Right;   FRACTURE SURGERY  1973   right femur   FRACTURE SURGERY     left thumb   knee arthoscopy     x3   LIVER BIOPSY     x12   ORIF PATELLA Right 03/07/2019   Procedure: OPEN REDUCTION INTERNAL (ORIF) FIXATION PATELLA;  Surgeon: Marchia Bond, MD;  Location: [facility];  Service: Orthopedics;  Laterality: Right;   OVARIAN CYST SURGERY     PARTIAL HYSTERECTOMY     had partial first, then later had all female organs removed   SPINE SURGERY  11-13-1999   vertebrae fusion   TONSILLECTOMY AND ADENOIDECTOMY     TUBAL LIGATION      Social History Suzanne Barton  reports that she has never smoked. She has been exposed to tobacco smoke. She has never used smokeless tobacco. She reports that she does not drink alcohol and does not use drugs.  family history includes COPD in her sister; Cancer in her mother; Fibromyalgia in her sister; Heart failure (age of onset: 65) in her father; Lung cancer in her mother  and sister.  Allergies  Allergen Reactions   Losartan Potassium-Hctz Shortness Of Breath    Rash   Tussionex Pennkinetic Er [Hydrocod Poli-Chlorphe Poli Er] Hives   Amoxicillin Rash    2 GM CEFAZOLIN ADMINISTERED WITHOUT ANY DIFFICULTIES OR REACTIONS 02/10/2016 @ 1900   Ibuprofen Rash   Sulfa Antibiotics Rash       PHYSICAL EXAMINATION: Vital signs: BP (!) 153/71   Pulse 86   Temp (!) 97.5 F (36.4 C)   Ht 5' 2.5" (1.588 m)   Wt 176 lb 6.4 oz (80 kg)   SpO2 97%   BMI 31.75 kg/m  General: Well-developed, well-nourished, no acute distress HEENT: Sclerae are anicteric, conjunctiva pink. Oral mucosa intact Lungs: Clear Heart: Regular Abdomen: soft, nontender, nondistended, no obvious ascites, no peritoneal signs, normal bowel sounds. No organomegaly. Extremities: No edema Psychiatric: alert and oriented x3. Cooperative      ASSESSMENT:  Colon cancer screening   PLAN:  Screening colonoscopy

## 2022-05-23 NOTE — Progress Notes (Signed)
Sedate, gd SR, tolerated procedure well, VSS, report to RN 

## 2022-05-24 ENCOUNTER — Telehealth: Payer: Self-pay

## 2022-05-24 NOTE — Telephone Encounter (Signed)
  Follow up Call-     05/23/2022    7:44 AM  Call back number  Post procedure Call Back phone  # [phone]  Permission to leave phone message Yes     Patient questions:  Do you have a fever, pain , or abdominal swelling? No. Pain Score  0 *  Have you tolerated food without any problems? yes  Have you been able to return to your normal activities? yes  Do you have any questions about your discharge instructions: Diet   No. Medications  No. Follow up visit  No.  Do you have questions or concerns about your Care? No.  Actions: * If pain score is 4 or above: No action needed, pain <4.

## 2022-05-25 ENCOUNTER — Encounter: Payer: Self-pay | Admitting: Internal Medicine

## 2022-05-31 NOTE — Progress Notes (Signed)
Sent message, via epic in basket, requesting order in epic from surgeon     05/31/22 1509  Preop Orders  Has preop orders? No  Name of staff/physician contacted for orders(Indicate phone or IB message) B. Owens Shark, PA-C.

## 2022-06-05 DIAGNOSIS — M1612 Unilateral primary osteoarthritis, left hip: Secondary | ICD-10-CM | POA: Diagnosis not present

## 2022-06-05 NOTE — H&P (Signed)
HIP ARTHROPLASTY ADMISSION H&P  Patient ID: Suzanne Barton MRN: [id number] DOB/AGE: 03/14/1951 71 y.o.  Chief Complaint: left hip pain.  Planned Procedure Date: 06/20/22  Medical Clearance by Dan Europe, FNP Cardiac Clearance by Dr. Einar Gip   HPI: Suzanne Barton is a 71 y.o. female who presents for evaluation of djd left hip. The patient has a history of pain and functional disability in the left hip due to arthritis and has failed non-surgical conservative treatments for greater than 12 weeks to include NSAID's and/or analgesics, corticosteriod injections, use of assistive devices, and activity modification.  Onset of symptoms was gradual, starting 2 years ago with rapidlly worsening course since that time. The patient noted no past surgery on the left hip.  Patient currently rates pain at 10 out of 10 with activity. Patient has worsening of pain with activity and weight bearing and pain that interferes with activities of daily living.  Patient has evidence of joint space narrowing by imaging studies.  There is no active infection.  Past Medical History:  Diagnosis Date   Allergy    Anemia    Arthritis    HIPS,KNEES   Blood transfusion    X 2   Femur fracture, right ([place]) 02/10/2016   GERD (gastroesophageal reflux disease)    Hepatitis C 1992   in remission   Hyperlipidemia    no meds   Hypertension    Neuromuscular disorder (HCC)    hiatal hernia   Right patella fracture    Seasonal allergies    Thyroid disease    "MILD"   Past Surgical History:  Procedure Laterality Date   APPENDECTOMY     CHOLECYSTECTOMY     COLONOSCOPY  05/23/2022   JP   cyst removed     x3- under right arm, x3- groin area- as a teenager   FEMUR IM NAIL Right 02/10/2016   Procedure: INTRAMEDULLARY (IM) NAIL FEMORAL;  Surgeon: Marchia Bond, MD;  Location: [facility];  Service: Orthopedics;  Laterality: Right;   FRACTURE SURGERY  09/12/1971   right femur   FRACTURE SURGERY     left thumb    knee arthoscopy     x3   LIVER BIOPSY     x12   ORIF PATELLA Right 03/07/2019   Procedure: OPEN REDUCTION INTERNAL (ORIF) FIXATION PATELLA;  Surgeon: Marchia Bond, MD;  Location: [facility];  Service: Orthopedics;  Laterality: Right;   OVARIAN CYST SURGERY     PARTIAL HYSTERECTOMY     had partial first, then later had all female organs removed   SPINE SURGERY  11/13/1999   vertebrae fusion   TONSILLECTOMY AND ADENOIDECTOMY     TUBAL LIGATION     Allergies  Allergen Reactions   Losartan Potassium-Hctz Shortness Of Breath    Rash   Tape     Redness    Tussionex Pennkinetic Er [Hydrocod Poli-Chlorphe Poli Er] Hives   Amoxicillin Rash    2 GM CEFAZOLIN ADMINISTERED WITHOUT ANY DIFFICULTIES OR REACTIONS 02/10/2016 @ 1900   Ibuprofen Rash   Sulfa Antibiotics Rash   Prior to Admission medications   Medication Sig Start Date End Date Taking? Authorizing Provider  aspirin (ASPIRIN CHILDRENS) 81 MG chewable tablet Chew 1 tablet (81 mg total) by mouth daily. Patient taking differently: Chew 81 mg by mouth at bedtime. 03/16/21  Yes Adrian Prows, MD  atenolol (TENORMIN) 25 MG tablet Take 0.5 tablets (12.5 mg total) by mouth daily. Patient taking differently: Take 12.5 mg by mouth  at bedtime. 03/16/22  Yes Cantwell, Celeste C, PA-C  Carboxymethylcellulose Sodium (REFRESH TEARS OP) Place 1-2 drops into both eyes daily as needed (dry eyes).   Yes [provider]  cetirizine (ZYRTEC) 10 MG tablet Take 10 mg by mouth daily.   Yes [provider]  ezetimibe-simvastatin (VYTORIN) 10-40 MG tablet TAKE ONE TABLET BY MOUTH ONCE DAILY AT 6:00 P.M. Patient taking differently: Take 1 tablet by mouth at bedtime. 09/27/21  Yes Adrian Prows, MD  montelukast (SINGULAIR) 10 MG tablet Take 10 mg by mouth daily.   Yes [provider]  naproxen sodium (ALEVE) 220 MG tablet Take 220-440 mg by mouth daily as needed (pain).   Yes [provider]  pantoprazole (PROTONIX) 40  MG tablet Take 40 mg by mouth daily.   Yes [provider]  spironolactone (ALDACTONE) 25 MG tablet TAKE 1 TABLET BY MOUTH IN THE MORNING 03/08/22  Yes Cantwell, Celeste C, PA-C  Vitamin D, Ergocalciferol, (DRISDOL) 1.25 MG (50000 UNIT) CAPS capsule Take 50,000 Units by mouth every Thursday.   Yes [provider]   Social History   Socioeconomic History   Marital status: Married    Spouse name: Not on file   Number of children: 1   Years of education: Not on file   Highest education level: Not on file  Occupational History   Occupation: home maker  Tobacco Use   Smoking status: Never    Passive exposure: Past (HUSBAND SMOKED 17 YEARS AGO)   Smokeless tobacco: Never   Tobacco comments:    husband smoked x 13 yrs  Vaping Use   Vaping Use: Never used  Substance and Sexual Activity   Alcohol use: No   Drug use: No   Sexual activity: Not on file  Other Topics Concern   Not on file  Social History Narrative   1 ADOPTED CHILD   Social Determinants of Health   Financial Resource Strain: Not on file  Food Insecurity: Not on file  Transportation Needs: Not on file  Physical Activity: Not on file  Stress: Not on file  Social Connections: Not on file   Family History  Problem Relation Age of Onset   Lung cancer Mother    Cancer Mother        lung and brain   Heart failure Father 68   COPD Sister    Lung cancer Sister    Fibromyalgia Sister    Colon cancer Neg Hx    Esophageal cancer Neg Hx    Stomach cancer Neg Hx    Rectal cancer Neg Hx    Colon polyps Neg Hx    Crohn's disease Neg Hx     ROS: Currently denies lightheadedness, dizziness, Fever, chills, CP, SOB.   No personal history of DVT, PE, MI, or CVA. Patient has dentures.  All other systems have been reviewed and were otherwise currently negative with the exception of those mentioned in the HPI and as above.  Objective: Vitals: Ht: '5\' 2"'$  Wt: 177 lbs Temp: 98.2 BP: 171/68 Pulse: 60 O2 97% on  room air.   Physical Exam: General: Alert, NAD. Trendelenberg Gait  HEENT: EOMI, Good Neck Extension  Pulm: No increased work of breathing.  Clear B/L A/P w/o crackle or wheeze.  CV: RRR, No m/g/r appreciated  GI: soft, NT, ND Neuro: Neuro without gross focal deficit.  Sensation intact distally Skin: No lesions in the area of chief complaint MSK/Surgical Site: left Hip pain with passive ROM.  10  deg active int rotation, 40 deg active ext rotation, both create pain. 5/5 strength.  NVI.    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   The bone quality appears to be adequate for age and reported activity level.  Preoperative templating of the joint replacement has been completed, documented, and submitted to the Operating Room personnel in order to optimize intra-operative equipment management.  Assessment: djd left hip    Plan: Plan for Procedure(s): TOTAL HIP ARTHROPLASTY  The patient history, physical exam, clinical judgement of the provider and imaging are consistent with end stage degenerative joint disease and total joint arthroplasty is deemed medically necessary. The treatment options including medical management, injection therapy, and arthroplasty were discussed at length. The risks and benefits of Procedure(s): TOTAL HIP ARTHROPLASTY were presented and reviewed.  The risks of nonoperative treatment, versus surgical intervention including but not limited to continued pain, aseptic loosening, stiffness, dislocation/subluxation, infection, bleeding, nerve injury, blood clots, cardiopulmonary complications, morbidity, mortality, among others were discussed. The patient verbalizes understanding and wishes to proceed with the plan.    Dental prophylaxis discussed and recommended for 2 years postoperatively.  The patient does meet the criteria for TXA which will be used perioperatively.   ASA 325 mg will be used postoperatively  for DVT prophylaxis in addition to SCDs, and early ambulation. The patient is planning to be discharged home with OPPT in care of husband   Ventura Bruns, Hershal Coria 06/05/2022 4:53 PM

## 2022-06-06 NOTE — Progress Notes (Addendum)
COVID Vaccine Completed:  Date of COVID positive in last 90 days:  PCP - Campbell Stall, FNP Cardiologist - Adrian Prows, MD  Chest x-ray - 01-31-22 CEW EKG - 03-30-22 Epic Stress Test - 02-14-21 Epic ECHO - 02-03-21 Epic Cardiac Cath -  Pacemaker/ICD device last checked: Spinal Cord Stimulator:  Bowel Prep -   Sleep Study -  CPAP -   Fasting Blood Sugar -  Checks Blood Sugar _____ times a day  Blood Thinner Instructions: Aspirin Instructions:  ASA 81 mg  Last Dose:  Activity level:  Can go up a flight of stairs and perform activities of daily living without stopping and without symptoms of chest pain or shortness of breath.  Able to exercise without symptoms  Unable to go up a flight of stairs without symptoms of     Anesthesia review: Sinus bradycardia and fatigue followed by cardiology.  HTN, hx of Hep C  Patient denies shortness of breath, fever, cough and chest pain at PAT appointment  Patient verbalized understanding of instructions that were given to them at the PAT appointment. Patient was also instructed that they will need to review over the PAT instructions again at home before surgery.

## 2022-06-06 NOTE — Patient Instructions (Addendum)
SURGICAL WAITING ROOM VISITATION Patients having surgery or a procedure may have no more than 2 support people in the waiting area - these visitors may rotate.   Children under the age of 44 must have an adult with them who is not the patient. If the patient needs to stay at the hospital during part of their recovery, the visitor guidelines for inpatient rooms apply. Pre-op nurse will coordinate an appropriate time for 1 support person to accompany patient in pre-op.  This support person may not rotate.    Please refer to the [facility] website for the visitor guidelines for Inpatients (after your surgery is over and you are in a regular room).      Your procedure is scheduled on: 06-20-22   Report to [facility] Main Entrance    Report to admitting at 5:15 AM   Call this number if you have problems the morning of surgery [phone]   Do not eat food :After Midnight.   After Midnight you may have the following liquids until 4:30 AM DAY OF SURGERY  Water Non-Citrus Juices (without pulp, NO RED) Carbonated Beverages Black Coffee (NO MILK/CREAM OR CREAMERS, sugar ok)  Clear Tea (NO MILK/CREAM OR CREAMERS, sugar ok) regular and decaf                             Plain Jell-O (NO RED)                                           Fruit ices (not with fruit pulp, NO RED)                                     Popsicles (NO RED)                                                               Sports drinks like Gatorade (NO RED)                   The day of surgery:  Drink ONE (1) Pre-Surgery Clear Ensure at 4:30 AM the morning of surgery. Drink in one sitting. Do not sip.  This drink was given to you during your hospital  pre-op appointment visit. Nothing else to drink after completing the Pre-Surgery Clear Ensure .          If you have questions, please contact your surgeon's office.   FOLLOW ANY ADDITIONAL PRE OP INSTRUCTIONS YOU RECEIVED FROM YOUR SURGEON'S OFFICE!!!      Oral Hygiene is also important to reduce your risk of infection.                                    Remember - BRUSH YOUR TEETH THE MORNING OF SURGERY WITH YOUR REGULAR TOOTHPASTE   Do NOT smoke after Midnight   Take these medicines the morning of surgery with A SIP OF WATER:   Zyrtec  Singulair  Pantoprazole  You may not have any metal on your body including hair pins, jewelry, and body piercing             Do not wear make-up, lotions, powders, perfumes or deodorant  Do not wear nail polish including gel and S&S, artificial/acrylic nails, or any other type of covering on natural nails including finger and toenails. If you have artificial nails, gel coating, etc. that needs to be removed by a nail salon please have this removed prior to surgery or surgery may need to be canceled/ delayed if the surgeon/ anesthesia feels like they are unable to be safely monitored.   Do not shave  48 hours prior to surgery.    Do not bring valuables to the hospital. [place].   Contacts, dentures or bridgework may not be worn into surgery.  DO NOT [place]. PHARMACY WILL DISPENSE MEDICATIONS LISTED ON YOUR MEDICATION LIST TO YOU DURING YOUR ADMISSION [place]!   Patients discharged on the day of surgery will not be allowed to drive home.  Someone NEEDS to stay with you for the first 24 hours after anesthesia.  Special Instructions: Bring a copy of your healthcare power of attorney and living will documents the day of surgery if you haven't scanned them before.  Please read over the following fact sheets you were given: IF [place]  If you received a COVID test during your pre-op visit  it is requested that you wear a mask when out in public, stay away from anyone that may not be feeling well and notify your surgeon if you  develop symptoms. If you test positive for Covid or have been in contact with anyone that has tested positive in the last 10 days please notify you surgeon.  [facility] - Preparing for Surgery Before surgery, you can play an important role.  Because skin is not sterile, your skin needs to be as free of germs as possible.  You can reduce the number of germs on your skin by washing with CHG (chlorahexidine gluconate) soap before surgery.  CHG is an antiseptic cleaner which kills germs and bonds with the skin to continue killing germs even after washing. Please DO NOT use if you have an allergy to CHG or antibacterial soaps.  If your skin becomes reddened/irritated stop using the CHG and inform your nurse when you arrive at Short Stay. Do not shave (including legs and underarms) for at least 48 hours prior to the first CHG shower.  You may shave your face/neck.  Please follow these instructions carefully:  1.  Shower with CHG Soap the night before surgery and the  morning of surgery.  2.  If you choose to wash your hair, wash your hair first as usual with your normal  shampoo.  3.  After you shampoo, rinse your hair and body thoroughly to remove the shampoo.                             4.  Use CHG as you would any other liquid soap.  You can apply chg directly to the skin and wash.  Gently with a scrungie or clean washcloth.  5.  Apply the CHG Soap to your body ONLY FROM THE NECK DOWN.   Do   not use on face/ open  Wound or open sores. Avoid contact with eyes, ears mouth and   genitals (private parts).                       Wash face,  Genitals (private parts) with your normal soap.             6.  Wash thoroughly, paying special attention to the area where your    surgery  will be performed.  7.  Thoroughly rinse your body with warm water from the neck down.  8.  DO NOT shower/wash with your normal soap after using and rinsing off the CHG Soap.                9.  Pat  yourself dry with a clean towel.            10.  Wear clean pajamas.            11.  Place clean sheets on your bed the night of your first shower and do not  sleep with pets. Day of Surgery : Do not apply any lotions/deodorants the morning of surgery.  Please wear clean clothes to the hospital/surgery center.  FAILURE TO FOLLOW THESE INSTRUCTIONS MAY RESULT IN THE CANCELLATION OF YOUR SURGERY  PATIENT SIGNATURE_________________________________  NURSE SIGNATURE__________________________________  ________________________________________________________________________     Adam Phenix  An incentive spirometer is a tool that can help keep your lungs clear and active. This tool measures how well you are filling your lungs with each breath. Taking long deep breaths may help reverse or decrease the chance of developing breathing (pulmonary) problems (especially infection) following: A long period of time when you are unable to move or be active. BEFORE THE PROCEDURE  If the spirometer includes an indicator to show your best effort, your nurse or respiratory therapist will set it to a desired goal. If possible, sit up straight or lean slightly forward. Try not to slouch. Hold the incentive spirometer in an upright position. INSTRUCTIONS FOR USE  Sit on the edge of your bed if possible, or sit up as far as you can in bed or on a chair. Hold the incentive spirometer in an upright position. Breathe out normally. Place the mouthpiece in your mouth and seal your lips tightly around it. Breathe in slowly and as deeply as possible, raising the piston or the ball toward the top of the column. Hold your breath for 3-5 seconds or for as long as possible. Allow the piston or ball to fall to the bottom of the column. Remove the mouthpiece from your mouth and breathe out normally. Rest for a few seconds and repeat Steps 1 through 7 at least 10 times every 1-2 hours when you are awake. Take your  time and take a few normal breaths between deep breaths. The spirometer may include an indicator to show your best effort. Use the indicator as a goal to work toward during each repetition. After each set of 10 deep breaths, practice coughing to be sure your lungs are clear. If you have an incision (the cut made at the time of surgery), support your incision when coughing by placing a pillow or rolled up towels firmly against it. Once you are able to get out of bed, walk around indoors and cough well. You may stop using the incentive spirometer when instructed by your caregiver.  RISKS AND COMPLICATIONS Take your time so you do not get dizzy or light-headed. If you are in  pain, you may need to take or ask for pain medication before doing incentive spirometry. It is harder to take a deep breath if you are having pain. AFTER USE Rest and breathe slowly and easily. It can be helpful to keep track of a log of your progress. Your caregiver can provide you with a simple table to help with this. If you are using the spirometer at home, follow these instructions: [place] IF:  You are having difficultly using the spirometer. You have trouble using the spirometer as often as instructed. Your pain medication is not giving enough relief while using the spirometer. You develop fever of 100.5 F (38.1 C) or higher. SEEK IMMEDIATE MEDICAL CARE IF:  You cough up bloody sputum that had not been present before. You develop fever of 102 F (38.9 C) or greater. You develop worsening pain at or near the incision site. MAKE SURE YOU:  Understand these instructions. Will watch your condition. Will get help right away if you are not doing well or get worse. Document Released: 01/08/2007 Document Revised: 11/20/2011 Document Reviewed: 03/11/2007 [facility] Patient Information 2014 [place], [place].   ________________________________________________________________________

## 2022-06-07 ENCOUNTER — Encounter (HOSPITAL_COMMUNITY): Payer: Self-pay

## 2022-06-07 ENCOUNTER — Other Ambulatory Visit: Payer: Self-pay

## 2022-06-07 ENCOUNTER — Encounter (HOSPITAL_COMMUNITY)
Admission: RE | Admit: 2022-06-07 | Discharge: 2022-06-07 | Disposition: A | Discharge status: Home / Self Care | Payer: BC Managed Care – PPO | Source: Ambulatory Visit | Attending: Orthopedic Surgery | Admitting: Orthopedic Surgery

## 2022-06-07 VITALS — BP 168/65 | HR 56 | Temp 98.2°F | Resp 16 | Ht 62.5 in | Wt 173.4 lb

## 2022-06-07 DIAGNOSIS — Z01812 Encounter for preprocedural laboratory examination: Secondary | ICD-10-CM | POA: Insufficient documentation

## 2022-06-07 DIAGNOSIS — I2510 Atherosclerotic heart disease of native coronary artery without angina pectoris: Secondary | ICD-10-CM | POA: Insufficient documentation

## 2022-06-07 DIAGNOSIS — K769 Liver disease, unspecified: Secondary | ICD-10-CM

## 2022-06-07 DIAGNOSIS — Z01818 Encounter for other preprocedural examination: Secondary | ICD-10-CM

## 2022-06-07 HISTORY — DX: Pneumonia, unspecified organism: J18.9

## 2022-06-07 LAB — SURGICAL PCR SCREEN
MRSA, PCR: NEGATIVE
Staphylococcus aureus: POSITIVE — AB

## 2022-06-07 LAB — COMPREHENSIVE METABOLIC PANEL
ALT: 20 U/L (ref 0–44)
AST: 22 U/L (ref 15–41)
Albumin: 3.8 g/dL (ref 3.5–5.0)
Alkaline Phosphatase: 79 U/L (ref 38–126)
Anion gap: 5 (ref 5–15)
BUN: 15 mg/dL (ref 8–23)
CO2: 28 mmol/L (ref 22–32)
Calcium: 9.7 mg/dL (ref 8.9–10.3)
Chloride: 106 mmol/L (ref 98–111)
Creatinine, Ser: 1.14 mg/dL — ABNORMAL HIGH (ref 0.44–1.00)
GFR, Estimated: 52 mL/min — ABNORMAL LOW (ref >60)
Glucose, Bld: 94 mg/dL (ref 70–99)
Potassium: 4.3 mmol/L (ref 3.5–5.1)
Sodium: 139 mmol/L (ref 135–145)
Total Bilirubin: 1 mg/dL (ref 0.3–1.2)
Total Protein: 7.3 g/dL (ref 6.5–8.1)

## 2022-06-07 LAB — CBC
HCT: 42.5 % (ref 36.0–46.0)
Hemoglobin: 14.3 g/dL (ref 12.0–15.0)
MCH: 30.6 pg (ref 26.0–34.0)
MCHC: 33.6 g/dL (ref 30.0–36.0)
MCV: 91 fL (ref 80.0–100.0)
Platelets: 190 10*3/uL (ref 150–400)
RBC: 4.67 MIL/uL (ref 3.87–5.11)
RDW: 12.7 % (ref 11.5–15.5)
WBC: 4.8 10*3/uL (ref 4.0–10.5)
nRBC: 0 % (ref 0.0–0.2)

## 2022-06-08 NOTE — Progress Notes (Signed)
PCR results sent to Dr. Mardelle Matte to review.

## 2022-06-09 NOTE — Progress Notes (Addendum)
Anesthesia Chart Review   Case: [id number] Date/Time: 06/20/22 0715   Procedure: TOTAL HIP ARTHROPLASTY (Left: Hip)   Anesthesia type: Spinal   Pre-op diagnosis: djd left hip   Location: [facility] ROOM 07 / [facility]   Surgeons: Marchia Bond, MD       DISCUSSION:71 y.o. never smoker with h/o HTN, hepatitis C, carotid stenosis, left hip djd scheduled for above procedure 06/20/2022 with Dr. Marchia Bond.   Cardiology clearance on chart dates 04/27/2022 which states, "Shanell Aden Sturgill is at low risk, from a cardiac standpoint, for her upcoming procedure: Left total hip replacement. It is ok to proceed without further cardiac testing. Patient is on ASA 81 mg for carotid stenosis."  Anticipate pt can proceed with planned procedure barring acute status change.   VS: BP (!) 168/65   Pulse (!) 56   Temp 36.8 C (Oral)   Resp 16   Ht 5' 2.5" (1.588 m)   Wt 78.7 kg   SpO2 100%   BMI 31.21 kg/m   PROVIDERS: [place], Brennan Bailey, FNP is PCP    LABS: Labs reviewed: Acceptable for surgery. (all labs ordered are listed, but only abnormal results are displayed)  Labs Reviewed  SURGICAL PCR SCREEN - Abnormal; Notable for the following components:      Result Value   Staphylococcus aureus POSITIVE (*)    All other components within normal limits  COMPREHENSIVE METABOLIC PANEL - Abnormal; Notable for the following components:   Creatinine, Ser 1.14 (*)    GFR, Estimated 52 (*)    All other components within normal limits  CBC     IMAGES: Carotid artery duplex 03/06/2022:  Duplex suggests stenosis in the right internal carotid artery (50-69%).  Duplex suggests stenosis in the left internal carotid artery (50-69%).  Antegrade right vertebral artery flow. Antegrade left vertebral artery  flow.  No significant change from 10/05/2021. Follow up in six months is  appropriate if clinically indicated.  EKG:   CV: Lexiscan Tetrofosmin stress test 02/14/2021: 1 Day Rest/Stress  Protocol. Stress EKG is non-diagnostic, as this is pharmacological stress test using Lexiscan. Normal myocardial perfusion. No convincing evidence of reversible myocardial ischemia or prior infarct.   Left ventricular ejection fraction is 58% with normal wall motion.   No prior studies for comparison. Low risk study.  Echocardiogram 02/03/2021:  Normal LV systolic function with visual EF 60-65%. Left ventricle cavity  is normal in size. Normal global wall motion. Normal diastolic filling  pattern, normal LAP.  Mild (Grade I) aortic regurgitation.  No prior study for comparison. Past Medical History:  Diagnosis Date   Allergy    Anemia    Arthritis    HIPS,KNEES   Blood transfusion    X 2   Femur fracture, right ([place]) 02/10/2016   GERD (gastroesophageal reflux disease)    Hepatitis C 1992   in remission   Hyperlipidemia    no meds   Hypertension    Neuromuscular disorder (HCC)    hiatal hernia   Pneumonia    Right patella fracture    Seasonal allergies    Thyroid disease    "MILD"    Past Surgical History:  Procedure Laterality Date   APPENDECTOMY     CHOLECYSTECTOMY     COLONOSCOPY  05/23/2022   [facility]   cyst removed     x3- under right arm, x3- groin area- as a teenager   FEMUR IM NAIL Right 02/10/2016   Procedure: INTRAMEDULLARY (IM) NAIL FEMORAL;  Surgeon:  Marchia Bond, MD;  Location: [place];  Service: Orthopedics;  Laterality: Right;   FRACTURE SURGERY  09/12/1971   right femur   FRACTURE SURGERY     left thumb   knee arthoscopy     x3   LIVER BIOPSY     x12   ORIF PATELLA Right 03/07/2019   Procedure: OPEN REDUCTION INTERNAL (ORIF) FIXATION PATELLA;  Surgeon: Marchia Bond, MD;  Location: [place];  Service: Orthopedics;  Laterality: Right;   OVARIAN CYST SURGERY     PARTIAL HYSTERECTOMY     had partial first, then later had all female organs removed   SPINE SURGERY  11/13/1999   vertebrae fusion   TONSILLECTOMY AND ADENOIDECTOMY      TUBAL LIGATION      MEDICATIONS:  aspirin (ASPIRIN CHILDRENS) 81 MG chewable tablet   atenolol (TENORMIN) 25 MG tablet   Carboxymethylcellulose Sodium (REFRESH TEARS OP)   cetirizine (ZYRTEC) 10 MG tablet   ezetimibe-simvastatin (VYTORIN) 10-40 MG tablet   montelukast (SINGULAIR) 10 MG tablet   naproxen sodium (ALEVE) 220 MG tablet   pantoprazole (PROTONIX) 40 MG tablet   spironolactone (ALDACTONE) 25 MG tablet   Vitamin D, Ergocalciferol, (DRISDOL) 1.25 MG (50000 UNIT) CAPS capsule   No current facility-administered medications for this encounter.     Konrad Felix Ward, PA-C [facility] [phone]

## 2022-06-09 NOTE — Anesthesia Preprocedure Evaluation (Addendum)
Anesthesia Evaluation  Patient identified by MRN, date of birth, ID band Patient awake    Reviewed: Allergy & Precautions, NPO status , Patient's Chart, lab work & pertinent test results, reviewed documented beta blocker date and time   History of Anesthesia Complications Negative for: history of anesthetic complications  Airway Mallampati: II  TM Distance: >3 FB Neck ROM: Full    Dental  (+) Edentulous Lower, Edentulous Upper   Pulmonary neg pulmonary ROS,    Pulmonary exam normal        Cardiovascular hypertension, Pt. on home beta blockers and Pt. on medications Normal cardiovascular exam     Neuro/Psych negative neurological ROS  negative psych ROS   GI/Hepatic hiatal hernia, GERD  Medicated and Controlled,(+) Hepatitis -, C  Endo/Other  negative endocrine ROS  Renal/GU Cr 1.14  negative genitourinary   Musculoskeletal  (+) Arthritis ,   Abdominal   Peds  Hematology negative hematology ROS (+)   Anesthesia Other Findings Day of surgery medications reviewed with patient.  Reproductive/Obstetrics negative OB ROS                           Anesthesia Physical Anesthesia Plan  ASA: 2  Anesthesia Plan: Spinal   Post-op Pain Management: Tylenol PO (pre-op)*   Induction:   PONV Risk Score and Plan: 3 and Treatment may vary due to age or medical condition, Ondansetron, Propofol infusion, Dexamethasone and Midazolam  Airway Management Planned: Natural Airway and Simple Face Mask  Additional Equipment: None  Intra-op Plan:   Post-operative Plan:   Informed Consent: I have reviewed the patients History and Physical, chart, labs and discussed the procedure including the risks, benefits and alternatives for the proposed anesthesia with the patient or authorized representative who has indicated his/her understanding and acceptance.       Plan Discussed with: CRNA  Anesthesia Plan  Comments: (See PAT note 06/07/2022)      Anesthesia Quick Evaluation

## 2022-06-12 DIAGNOSIS — S300XXA Contusion of lower back and pelvis, initial encounter: Secondary | ICD-10-CM | POA: Diagnosis not present

## 2022-06-20 ENCOUNTER — Encounter (HOSPITAL_COMMUNITY)
Admission: RE | Disposition: A | Discharge status: Home / Self Care | Payer: Self-pay | Source: Home / Self Care | Attending: Orthopedic Surgery

## 2022-06-20 ENCOUNTER — Ambulatory Visit (HOSPITAL_COMMUNITY): Payer: BC Managed Care – PPO | Admitting: Certified Registered"

## 2022-06-20 ENCOUNTER — Observation Stay (HOSPITAL_COMMUNITY)
Admission: RE | Admit: 2022-06-20 | Discharge: 2022-06-21 | Disposition: A | Discharge status: Home / Self Care | Payer: BC Managed Care – PPO | Attending: Orthopedic Surgery | Admitting: Orthopedic Surgery

## 2022-06-20 ENCOUNTER — Encounter (HOSPITAL_COMMUNITY): Payer: Self-pay | Admitting: Orthopedic Surgery

## 2022-06-20 ENCOUNTER — Ambulatory Visit (HOSPITAL_COMMUNITY): Payer: BC Managed Care – PPO | Admitting: Physician Assistant

## 2022-06-20 ENCOUNTER — Ambulatory Visit (HOSPITAL_COMMUNITY): Payer: BC Managed Care – PPO

## 2022-06-20 ENCOUNTER — Other Ambulatory Visit: Payer: Self-pay

## 2022-06-20 DIAGNOSIS — Z471 Aftercare following joint replacement surgery: Secondary | ICD-10-CM | POA: Diagnosis not present

## 2022-06-20 DIAGNOSIS — Z7982 Long term (current) use of aspirin: Secondary | ICD-10-CM | POA: Diagnosis not present

## 2022-06-20 DIAGNOSIS — Z7722 Contact with and (suspected) exposure to environmental tobacco smoke (acute) (chronic): Secondary | ICD-10-CM | POA: Diagnosis not present

## 2022-06-20 DIAGNOSIS — Z96642 Presence of left artificial hip joint: Secondary | ICD-10-CM | POA: Diagnosis not present

## 2022-06-20 DIAGNOSIS — Z79899 Other long term (current) drug therapy: Secondary | ICD-10-CM | POA: Diagnosis not present

## 2022-06-20 DIAGNOSIS — M1612 Unilateral primary osteoarthritis, left hip: Principal | ICD-10-CM | POA: Insufficient documentation

## 2022-06-20 DIAGNOSIS — I10 Essential (primary) hypertension: Secondary | ICD-10-CM | POA: Diagnosis not present

## 2022-06-20 DIAGNOSIS — I2510 Atherosclerotic heart disease of native coronary artery without angina pectoris: Secondary | ICD-10-CM

## 2022-06-20 HISTORY — PX: TOTAL HIP ARTHROPLASTY: SHX124

## 2022-06-20 LAB — TYPE AND SCREEN
ABO/RH(D): A NEG
Antibody Screen: NEGATIVE
Weak D: POSITIVE

## 2022-06-20 LAB — BASIC METABOLIC PANEL
Anion gap: 6 (ref 5–15)
BUN: 18 mg/dL (ref 8–23)
CO2: 26 mmol/L (ref 22–32)
Calcium: 9 mg/dL (ref 8.9–10.3)
Chloride: 104 mmol/L (ref 98–111)
Creatinine, Ser: 1.25 mg/dL — ABNORMAL HIGH (ref 0.44–1.00)
GFR, Estimated: 47 mL/min — ABNORMAL LOW (ref >60)
Glucose, Bld: 81 mg/dL (ref 70–99)
Potassium: 3.9 mmol/L (ref 3.5–5.1)
Sodium: 136 mmol/L (ref 135–145)

## 2022-06-20 LAB — ABO/RH
ABO/RH(D): A NEG
Weak D: POSITIVE

## 2022-06-20 SURGERY — ARTHROPLASTY, HIP, TOTAL,POSTERIOR APPROACH
Anesthesia: Spinal | Site: Hip | Laterality: Left

## 2022-06-20 MED ORDER — PROPOFOL 500 MG/50ML IV EMUL
INTRAVENOUS | Status: DC | PRN
  Administered 2022-06-20: 40 ug/kg/min via INTRAVENOUS

## 2022-06-20 MED ORDER — OXYCODONE HCL 5 MG PO TABS
10.0000 mg | ORAL_TABLET | ORAL | Status: DC | PRN
  Administered 2022-06-21: 10 mg via ORAL
  Filled 2022-06-20: qty 2

## 2022-06-20 MED ORDER — METHOCARBAMOL 500 MG PO TABS
500.0000 mg | ORAL_TABLET | Freq: Four times a day (QID) | ORAL | Status: DC | PRN
  Administered 2022-06-20: 500 mg via ORAL
  Filled 2022-06-20: qty 1

## 2022-06-20 MED ORDER — ATENOLOL 25 MG PO TABS
12.5000 mg | ORAL_TABLET | Freq: Every day | ORAL | Status: DC
  Administered 2022-06-20: 12.5 mg via ORAL
  Filled 2022-06-20: qty 1

## 2022-06-20 MED ORDER — DIPHENHYDRAMINE HCL 12.5 MG/5ML PO ELIX: 12.5000 mg | ORAL_SOLUTION | ORAL | Status: DC | PRN

## 2022-06-20 MED ORDER — MAGNESIUM CITRATE PO SOLN: 1.0000 | Freq: Once | ORAL | Status: DC | PRN

## 2022-06-20 MED ORDER — KETOROLAC TROMETHAMINE 30 MG/ML IJ SOLN
INTRAMUSCULAR | Status: DC | PRN
  Administered 2022-06-20: 30 mg

## 2022-06-20 MED ORDER — LACTATED RINGERS IV BOLUS
500.0000 mL | Freq: Once | INTRAVENOUS | Status: AC
  Administered 2022-06-20: 500 mL via INTRAVENOUS

## 2022-06-20 MED ORDER — BUPIVACAINE IN DEXTROSE 0.75-8.25 % IT SOLN
INTRATHECAL | Status: DC | PRN
  Administered 2022-06-20: 1.6 mL via INTRATHECAL

## 2022-06-20 MED ORDER — PANTOPRAZOLE SODIUM 40 MG PO TBEC
40.0000 mg | DELAYED_RELEASE_TABLET | Freq: Every day | ORAL | Status: DC
  Administered 2022-06-20 – 2022-06-21 (×2): 40 mg via ORAL
  Filled 2022-06-20 (×2): qty 1

## 2022-06-20 MED ORDER — POVIDONE-IODINE 7.5 % EX SOLN: Freq: Once | CUTANEOUS | Status: AC

## 2022-06-20 MED ORDER — ASPIRIN 325 MG PO TBEC: 325.0000 mg | DELAYED_RELEASE_TABLET | Freq: Two times a day (BID) | ORAL | 0 refills | Status: DC

## 2022-06-20 MED ORDER — PHENYLEPHRINE HCL (PRESSORS) 10 MG/ML IV SOLN
INTRAVENOUS | Status: AC
  Filled 2022-06-20: qty 1

## 2022-06-20 MED ORDER — METOCLOPRAMIDE HCL 5 MG/ML IJ SOLN
5.0000 mg | Freq: Three times a day (TID) | INTRAMUSCULAR | Status: DC | PRN
  Administered 2022-06-20: 10 mg via INTRAVENOUS
  Filled 2022-06-20: qty 2

## 2022-06-20 MED ORDER — ASPIRIN 325 MG PO TBEC
325.0000 mg | DELAYED_RELEASE_TABLET | Freq: Every day | ORAL | Status: DC
  Administered 2022-06-21: 325 mg via ORAL
  Filled 2022-06-20: qty 1

## 2022-06-20 MED ORDER — CHLORHEXIDINE GLUCONATE 0.12 % MT SOLN
15.0000 mL | Freq: Once | OROMUCOSAL | Status: AC
  Administered 2022-06-20: 15 mL via OROMUCOSAL

## 2022-06-20 MED ORDER — OXYCODONE HCL 5 MG/5ML PO SOLN: 5.0000 mg | Freq: Once | ORAL | Status: DC | PRN

## 2022-06-20 MED ORDER — ONDANSETRON HCL 4 MG/2ML IJ SOLN
4.0000 mg | Freq: Four times a day (QID) | INTRAMUSCULAR | Status: DC | PRN
  Administered 2022-06-20: 4 mg via INTRAVENOUS
  Filled 2022-06-20: qty 2

## 2022-06-20 MED ORDER — ONDANSETRON HCL 4 MG/2ML IJ SOLN
INTRAMUSCULAR | Status: DC | PRN
  Administered 2022-06-20: 4 mg via INTRAVENOUS

## 2022-06-20 MED ORDER — EZETIMIBE 10 MG PO TABS
10.0000 mg | ORAL_TABLET | Freq: Every day | ORAL | Status: DC
  Administered 2022-06-20: 10 mg via ORAL
  Filled 2022-06-20: qty 1

## 2022-06-20 MED ORDER — PHENOL 1.4 % MT LIQD: 1.0000 | OROMUCOSAL | Status: DC | PRN

## 2022-06-20 MED ORDER — MIDAZOLAM HCL 2 MG/2ML IJ SOLN
INTRAMUSCULAR | Status: AC
  Filled 2022-06-20: qty 2

## 2022-06-20 MED ORDER — FENTANYL CITRATE (PF) 100 MCG/2ML IJ SOLN
INTRAMUSCULAR | Status: AC
  Filled 2022-06-20: qty 2

## 2022-06-20 MED ORDER — SIMVASTATIN 20 MG PO TABS
40.0000 mg | ORAL_TABLET | Freq: Every day | ORAL | Status: DC
  Administered 2022-06-20: 40 mg via ORAL
  Filled 2022-06-20: qty 2

## 2022-06-20 MED ORDER — FENTANYL CITRATE PF 50 MCG/ML IJ SOSY
25.0000 ug | PREFILLED_SYRINGE | INTRAMUSCULAR | Status: DC | PRN
  Administered 2022-06-20 (×2): 50 ug via INTRAVENOUS

## 2022-06-20 MED ORDER — ALUM & MAG HYDROXIDE-SIMETH 200-200-20 MG/5ML PO SUSP: 30.0000 mL | ORAL | Status: DC | PRN

## 2022-06-20 MED ORDER — KETOROLAC TROMETHAMINE 30 MG/ML IJ SOLN
INTRAMUSCULAR | Status: AC
  Filled 2022-06-20: qty 1

## 2022-06-20 MED ORDER — DEXAMETHASONE SODIUM PHOSPHATE 10 MG/ML IJ SOLN
10.0000 mg | Freq: Once | INTRAMUSCULAR | Status: AC
  Administered 2022-06-21: 10 mg via INTRAVENOUS
  Filled 2022-06-20: qty 1

## 2022-06-20 MED ORDER — MIDAZOLAM HCL 2 MG/2ML IJ SOLN
INTRAMUSCULAR | Status: DC | PRN
  Administered 2022-06-20: 2 mg via INTRAVENOUS

## 2022-06-20 MED ORDER — TRANEXAMIC ACID-NACL 1000-0.7 MG/100ML-% IV SOLN
1000.0000 mg | INTRAVENOUS | Status: AC
  Administered 2022-06-20: 1000 mg via INTRAVENOUS
  Filled 2022-06-20: qty 100

## 2022-06-20 MED ORDER — ACETAMINOPHEN 500 MG PO TABS: 1000.0000 mg | ORAL_TABLET | Freq: Once | ORAL | Status: DC

## 2022-06-20 MED ORDER — BISACODYL 10 MG RE SUPP: 10.0000 mg | Freq: Every day | RECTAL | Status: DC | PRN

## 2022-06-20 MED ORDER — FENTANYL CITRATE PF 50 MCG/ML IJ SOSY
PREFILLED_SYRINGE | INTRAMUSCULAR | Status: AC
  Filled 2022-06-20: qty 1

## 2022-06-20 MED ORDER — ACETAMINOPHEN 500 MG PO TABS
1000.0000 mg | ORAL_TABLET | Freq: Four times a day (QID) | ORAL | Status: AC
  Administered 2022-06-20 – 2022-06-21 (×2): 1000 mg via ORAL
  Filled 2022-06-20: qty 2

## 2022-06-20 MED ORDER — STERILE WATER FOR IRRIGATION IR SOLN
Status: DC | PRN
  Administered 2022-06-20: 2000 mL

## 2022-06-20 MED ORDER — OXYCODONE HCL 5 MG PO TABS
ORAL_TABLET | ORAL | Status: AC
  Filled 2022-06-20: qty 2

## 2022-06-20 MED ORDER — GLYCOPYRROLATE 0.2 MG/ML IJ SOLN
INTRAMUSCULAR | Status: DC | PRN
  Administered 2022-06-20: .2 mg via INTRAVENOUS

## 2022-06-20 MED ORDER — EZETIMIBE-SIMVASTATIN 10-40 MG PO TABS
1.0000 | ORAL_TABLET | Freq: Every day | ORAL | Status: DC
  Filled 2022-06-20: qty 1

## 2022-06-20 MED ORDER — HYDROMORPHONE HCL 1 MG/ML IJ SOLN
0.5000 mg | INTRAMUSCULAR | Status: DC | PRN
  Administered 2022-06-20: 1 mg via INTRAVENOUS
  Filled 2022-06-20: qty 1

## 2022-06-20 MED ORDER — DOCUSATE SODIUM 100 MG PO CAPS
100.0000 mg | ORAL_CAPSULE | Freq: Two times a day (BID) | ORAL | Status: DC
  Administered 2022-06-20 – 2022-06-21 (×2): 100 mg via ORAL
  Filled 2022-06-20 (×2): qty 1

## 2022-06-20 MED ORDER — OXYCODONE HCL 5 MG PO TABS: 5.0000 mg | ORAL_TABLET | Freq: Once | ORAL | Status: DC | PRN

## 2022-06-20 MED ORDER — METHOCARBAMOL 500 MG IVPB - SIMPLE MED
INTRAVENOUS | Status: AC
  Filled 2022-06-20: qty 55

## 2022-06-20 MED ORDER — DEXAMETHASONE SODIUM PHOSPHATE 10 MG/ML IJ SOLN
INTRAMUSCULAR | Status: DC | PRN
  Administered 2022-06-20: 8 mg via INTRAVENOUS

## 2022-06-20 MED ORDER — POVIDONE-IODINE 10 % EX SWAB: 2.0000 | Freq: Once | CUTANEOUS | Status: DC

## 2022-06-20 MED ORDER — POLYVINYL ALCOHOL 1.4 % OP SOLN: 1.0000 [drp] | Freq: Every day | OPHTHALMIC | Status: DC | PRN

## 2022-06-20 MED ORDER — LACTATED RINGERS IV SOLN: INTRAVENOUS | Status: DC

## 2022-06-20 MED ORDER — TRANEXAMIC ACID-NACL 1000-0.7 MG/100ML-% IV SOLN
INTRAVENOUS | Status: AC
  Filled 2022-06-20: qty 100

## 2022-06-20 MED ORDER — SENNA-DOCUSATE SODIUM 8.6-50 MG PO TABS: 2.0000 | ORAL_TABLET | Freq: Every day | ORAL | 1 refills | Status: AC

## 2022-06-20 MED ORDER — CEFAZOLIN SODIUM-DEXTROSE 2-4 GM/100ML-% IV SOLN
INTRAVENOUS | Status: AC
  Filled 2022-06-20: qty 100

## 2022-06-20 MED ORDER — FENTANYL CITRATE (PF) 100 MCG/2ML IJ SOLN
INTRAMUSCULAR | Status: DC | PRN
  Administered 2022-06-20: 50 ug via INTRAVENOUS
  Administered 2022-06-20 (×2): 25 ug via INTRAVENOUS

## 2022-06-20 MED ORDER — MENTHOL 3 MG MT LOZG: 1.0000 | LOZENGE | OROMUCOSAL | Status: DC | PRN

## 2022-06-20 MED ORDER — PROPOFOL 10 MG/ML IV BOLUS
INTRAVENOUS | Status: DC | PRN
  Administered 2022-06-20: 10 mg via INTRAVENOUS
  Administered 2022-06-20: 30 mg via INTRAVENOUS
  Administered 2022-06-20 (×2): 20 mg via INTRAVENOUS
  Administered 2022-06-20: 10 mg via INTRAVENOUS

## 2022-06-20 MED ORDER — METHOCARBAMOL 500 MG PO TABS: 500.0000 mg | ORAL_TABLET | Freq: Three times a day (TID) | ORAL | 0 refills | Status: AC | PRN

## 2022-06-20 MED ORDER — ORAL CARE MOUTH RINSE: 15.0000 mL | Freq: Once | OROMUCOSAL | Status: AC

## 2022-06-20 MED ORDER — SPIRONOLACTONE 25 MG PO TABS
25.0000 mg | ORAL_TABLET | Freq: Every morning | ORAL | Status: DC
  Filled 2022-06-20: qty 1

## 2022-06-20 MED ORDER — PHENYLEPHRINE HCL-NACL 20-0.9 MG/250ML-% IV SOLN
INTRAVENOUS | Status: DC | PRN
  Administered 2022-06-20: 30 ug/min via INTRAVENOUS

## 2022-06-20 MED ORDER — MONTELUKAST SODIUM 10 MG PO TABS
10.0000 mg | ORAL_TABLET | Freq: Every day | ORAL | Status: DC
  Administered 2022-06-21: 10 mg via ORAL
  Filled 2022-06-20: qty 1

## 2022-06-20 MED ORDER — 0.9 % SODIUM CHLORIDE (POUR BTL) OPTIME
TOPICAL | Status: DC | PRN
  Administered 2022-06-20: 1000 mL

## 2022-06-20 MED ORDER — CEFAZOLIN SODIUM-DEXTROSE 2-4 GM/100ML-% IV SOLN
2.0000 g | Freq: Four times a day (QID) | INTRAVENOUS | Status: AC
  Administered 2022-06-20 (×2): 2 g via INTRAVENOUS
  Filled 2022-06-20: qty 100

## 2022-06-20 MED ORDER — OXYCODONE HCL 5 MG PO TABS
5.0000 mg | ORAL_TABLET | ORAL | Status: DC | PRN
  Administered 2022-06-20 (×2): 10 mg via ORAL
  Filled 2022-06-20: qty 2

## 2022-06-20 MED ORDER — BUPIVACAINE HCL (PF) 0.25 % IJ SOLN
INTRAMUSCULAR | Status: AC
  Filled 2022-06-20: qty 30

## 2022-06-20 MED ORDER — POTASSIUM CHLORIDE IN NACL 20-0.9 MEQ/L-% IV SOLN
INTRAVENOUS | Status: DC
  Filled 2022-06-20 (×3): qty 1000

## 2022-06-20 MED ORDER — TRANEXAMIC ACID-NACL 1000-0.7 MG/100ML-% IV SOLN
1000.0000 mg | Freq: Once | INTRAVENOUS | Status: AC
  Administered 2022-06-20: 1000 mg via INTRAVENOUS

## 2022-06-20 MED ORDER — LACTATED RINGERS IV BOLUS
250.0000 mL | Freq: Once | INTRAVENOUS | Status: AC
  Administered 2022-06-20: 250 mL via INTRAVENOUS

## 2022-06-20 MED ORDER — BUPIVACAINE HCL (PF) 0.25 % IJ SOLN
INTRAMUSCULAR | Status: DC | PRN
  Administered 2022-06-20: 30 mL

## 2022-06-20 MED ORDER — METHOCARBAMOL 500 MG IVPB - SIMPLE MED
500.0000 mg | Freq: Four times a day (QID) | INTRAVENOUS | Status: DC | PRN
  Administered 2022-06-20: 500 mg via INTRAVENOUS

## 2022-06-20 MED ORDER — METOCLOPRAMIDE HCL 5 MG PO TABS: 5.0000 mg | ORAL_TABLET | Freq: Three times a day (TID) | ORAL | Status: DC | PRN

## 2022-06-20 MED ORDER — CEFAZOLIN SODIUM-DEXTROSE 2-4 GM/100ML-% IV SOLN
2.0000 g | INTRAVENOUS | Status: AC
  Administered 2022-06-20: 2 g via INTRAVENOUS
  Filled 2022-06-20: qty 100

## 2022-06-20 MED ORDER — OXYCODONE HCL 5 MG PO TABS: 5.0000 mg | ORAL_TABLET | ORAL | 0 refills | Status: DC | PRN

## 2022-06-20 MED ORDER — ACETAMINOPHEN 325 MG PO TABS: 325.0000 mg | ORAL_TABLET | Freq: Four times a day (QID) | ORAL | Status: DC | PRN

## 2022-06-20 MED ORDER — ACETAMINOPHEN 500 MG PO TABS
1000.0000 mg | ORAL_TABLET | Freq: Once | ORAL | Status: AC
  Administered 2022-06-20: 1000 mg via ORAL
  Filled 2022-06-20: qty 2

## 2022-06-20 MED ORDER — ONDANSETRON HCL 4 MG PO TABS: 4.0000 mg | ORAL_TABLET | Freq: Four times a day (QID) | ORAL | Status: DC | PRN

## 2022-06-20 MED ORDER — POLYETHYLENE GLYCOL 3350 17 G PO PACK: 17.0000 g | PACK | Freq: Every day | ORAL | Status: DC | PRN

## 2022-06-20 MED ORDER — ACETAMINOPHEN 500 MG PO TABS
ORAL_TABLET | ORAL | Status: AC
  Filled 2022-06-20: qty 2

## 2022-06-20 MED ORDER — ONDANSETRON HCL 4 MG PO TABS: 4.0000 mg | ORAL_TABLET | Freq: Three times a day (TID) | ORAL | 0 refills | Status: DC | PRN

## 2022-06-20 MED ORDER — FENTANYL CITRATE PF 50 MCG/ML IJ SOSY
PREFILLED_SYRINGE | INTRAMUSCULAR | Status: AC
  Filled 2022-06-20: qty 2

## 2022-06-20 SURGICAL SUPPLY — 57 items
BAG COUNTER SPONGE SURGICOUNT (BAG) IMPLANT
BLADE SAW SGTL 73X25 THK (BLADE) ×1 IMPLANT
CLSR STERI-STRIP ANTIMIC 1/2X4 (GAUZE/BANDAGES/DRESSINGS) ×2 IMPLANT
COVER SURGICAL LIGHT HANDLE (MISCELLANEOUS) ×1 IMPLANT
DRAPE INCISE IOBAN 66X45 STRL (DRAPES) ×1 IMPLANT
DRAPE ORTHO SPLIT 77X108 STRL (DRAPES) ×2
DRAPE POUCH INSTRU U-SHP 10X18 (DRAPES) ×1 IMPLANT
DRAPE SHEET LG 3/4 BI-LAMINATE (DRAPES) ×1 IMPLANT
DRAPE SURG 17X11 SM STRL (DRAPES) ×1 IMPLANT
DRAPE SURG ORHT 6 SPLT 77X108 (DRAPES) ×2 IMPLANT
DRAPE U-SHAPE 47X51 STRL (DRAPES) ×1 IMPLANT
DRSG AQUACEL AG ADV 3.5X10 (GAUZE/BANDAGES/DRESSINGS) IMPLANT
DRSG MEPILEX POST OP 4X8 (GAUZE/BANDAGES/DRESSINGS) ×1 IMPLANT
DURAPREP 26ML APPLICATOR (WOUND CARE) ×2 IMPLANT
ELECT BLADE TIP CTD 4 INCH (ELECTRODE) ×1 IMPLANT
ELECT REM PT RETURN 15FT ADLT (MISCELLANEOUS) ×1 IMPLANT
ELIMINATOR HOLE APEX DEPUY (Hips) IMPLANT
FACESHIELD WRAPAROUND (MASK) ×2 IMPLANT
FACESHIELD WRAPAROUND OR TEAM (MASK) ×2 IMPLANT
GLOVE BIO SURGEON STRL SZ 6.5 (GLOVE) ×1 IMPLANT
GLOVE BIO SURGEON STRL SZ7.5 (GLOVE) ×1 IMPLANT
GLOVE BIOGEL PI IND STRL 7.0 (GLOVE) ×1 IMPLANT
GLOVE BIOGEL PI IND STRL 8 (GLOVE) ×1 IMPLANT
GOWN STRL SURGICAL XL XLNG (GOWN DISPOSABLE) ×2 IMPLANT
HEAD CERAMIC DELTA 36 PLUS 1.5 (Hips) IMPLANT
HOOD PEEL AWAY FLYTE STAYCOOL (MISCELLANEOUS) ×2 IMPLANT
K-WIRE 2.0 (WIRE) ×1
K-WIRE TROCAR PT 2.0 150MM (WIRE) ×1
KIT BASIN OR (CUSTOM PROCEDURE TRAY) ×1 IMPLANT
KIT TURNOVER KIT A (KITS) IMPLANT
KWIRE TROCAR PT 2.0 150 (WIRE) ×1 IMPLANT
KWIRE TROCAR PT 2.0 150MM (WIRE) ×1 IMPLANT
LINER NEUTRAL 52X36MM PLUS 4 (Liner) IMPLANT
MANIFOLD NEPTUNE II (INSTRUMENTS) ×1 IMPLANT
NDL MA TROC 1/2 (NEEDLE) IMPLANT
NDL SAFETY ECLIP 18X1.5 (MISCELLANEOUS) ×2 IMPLANT
NEEDLE ANCHOR KEITH 2 7/8 STR (NEEDLE) ×1 IMPLANT
NEEDLE MA TROC 1/2 (NEEDLE) IMPLANT
NS IRRIG 1000ML POUR BTL (IV SOLUTION) ×1 IMPLANT
PACK TOTAL JOINT (CUSTOM PROCEDURE TRAY) ×1 IMPLANT
PIN SECTOR W/GRIP ACE CUP 52MM (Hips) IMPLANT
PROTECTOR NERVE ULNAR (MISCELLANEOUS) ×1 IMPLANT
SCREW PINN CAN 6.5X20 (Screw) IMPLANT
STEM OFFSET DUOFIX SZ6 STD (Stem) IMPLANT
SUCTION FRAZIER HANDLE 12FR (TUBING) ×1
SUCTION TUBE FRAZIER 12FR DISP (TUBING) ×1 IMPLANT
SUT ETHIBOND NAB CT1 #1 30IN (SUTURE) ×3 IMPLANT
SUT VIC AB 0 CT1 36 (SUTURE) ×1 IMPLANT
SUT VIC AB 1 CT1 36 (SUTURE) ×2 IMPLANT
SUT VIC AB 2-0 CT1 27 (SUTURE) ×5
SUT VIC AB 2-0 CT1 TAPERPNT 27 (SUTURE) ×3 IMPLANT
SUT VIC AB 3-0 SH 8-18 (SUTURE) ×1 IMPLANT
SYR CONTROL 10ML LL (SYRINGE) ×2 IMPLANT
TOWEL OR 17X26 10 PK STRL BLUE (TOWEL DISPOSABLE) ×1 IMPLANT
TRAY FOLEY MTR SLVR 16FR STAT (SET/KITS/TRAYS/PACK) ×1 IMPLANT
TUBE SUCTION HIGH CAP CLEAR NV (SUCTIONS) ×1 IMPLANT
WATER STERILE IRR 1000ML POUR (IV SOLUTION) ×2 IMPLANT

## 2022-06-20 NOTE — Anesthesia Procedure Notes (Signed)
Spinal  Patient location during procedure: OR Start time: 06/20/2022 7:29 AM End time: 06/20/2022 7:32 AM Reason for block: surgical anesthesia Staffing Performed: anesthesiologist  Anesthesiologist: Brennan Bailey, MD Performed by: Brennan Bailey, MD Authorized by: Brennan Bailey, MD   Preanesthetic Checklist Completed: patient identified, IV checked, risks and benefits discussed, surgical consent, monitors and equipment checked, pre-op evaluation and timeout performed Spinal Block Patient position: sitting Prep: DuraPrep and site prepped and draped Patient monitoring: continuous pulse ox, blood pressure and heart rate Approach: midline Location: L3-4 Injection technique: single-shot Needle Needle type: Pencan  Needle gauge: 24 G Needle length: 9 cm Assessment Events: CSF return Additional Notes Risks, benefits, and alternative discussed. Patient gave consent to procedure. Prepped and draped in sitting position. Patient sedated but responsive to voice. Clear CSF obtained after one needle redirection. Positive terminal aspiration. No pain or paraesthesias with injection. Patient tolerated procedure well. Vital signs stable. Tawny Asal, MD

## 2022-06-20 NOTE — Op Note (Signed)
06/20/2022  9:35 AM  PATIENT:  Suzanne Barton   MRN: [id number]  PRE-OPERATIVE DIAGNOSIS: Left hip primary localized osteoarthritis  POST-OPERATIVE DIAGNOSIS:  same  PROCEDURE:  Procedure(s): TOTAL HIP ARTHROPLASTY  PREOPERATIVE INDICATIONS:    Suzanne Barton is an 71 y.o. female who has a diagnosis of left hip arthritis and elected for surgical management after failing conservative treatment.  The risks benefits and alternatives were discussed with the patient including but not limited to the risks of nonoperative treatment, versus surgical intervention including infection, bleeding, nerve injury, periprosthetic fracture, the need for revision surgery, dislocation, leg length discrepancy, blood clots, cardiopulmonary complications, morbidity, mortality, among others, and they were willing to proceed.     OPERATIVE REPORT     SURGEON:  Marchia Bond, MD    ASSISTANT:  Merlene Pulling, PA-C, (Present throughout the entire procedure,  necessary for completion of procedure in a timely manner, assisting with retraction, instrumentation, and closure)     ANESTHESIA: Spinal  ESTIMATED BLOOD LOSS: 824 mL    COMPLICATIONS:  None.     UNIQUE ASPECTS OF THE CASE: She had substantial contracture, and was extremely tight both preoperatively and postoperatively.  She was almost unable to be externally rotated for prepping purposes.  I did not medialized completely, but did have complete coverage on the cup, with excellent press-fit.  I was matching posteriorly and superiorly, and had just a little bit of anterior wall showing.  She was extremely stable, although the tissues were contracted and did not quite reach the trochanter upon repair.  She did not feel long compared to the other side, and the other side already is arthritic and shortened, I debated about going to a shorter neck, but I felt that the issue is primarily advanced chronic shortening of the tissues.  COMPONENTS:  [employer] fit femur size 6 with a 35 mm + 1.5 ceramic head ball and a Gription Acetabular shell size 52, with a single cancellous screw for backup fixation, with an apex hole eliminator and a +4 neutral polyethylene liner.    PROCEDURE IN DETAIL:   The patient was met in the holding area and  identified.  The appropriate hip was identified and marked at the operative site.  The patient was then transported to the OR  and  placed under anesthesia.  At that point, the patient was  placed in the lateral decubitus position with the operative side up and  secured to the operating room table and all bony prominences padded.     The operative lower extremity was prepped from the iliac crest to the distal leg.  Sterile draping was performed.  Time out was performed prior to incision.      A routine posterolateral approach was utilized via sharp dissection  carried down to the subcutaneous tissue.  Gross bleeders were Bovie coagulated.  The iliotibial band was identified and incised along the length of the skin incision.  Self-retaining retractors were  inserted.  With the hip internally rotated, the short external rotators  were identified. The piriformis and capsule was tagged with FiberWire, and the hip capsule released in a T-type fashion.  The femoral neck was exposed, and I resected the femoral neck using the appropriate jig. This was performed at approximately a thumb's breadth above the lesser trochanter.    I then exposed the deep acetabulum, cleared out any tissue including the ligamentum teres.  A wing retractor was placed.  After adequate visualization, I excised  the labrum, and then sequentially reamed.  I placed the trial acetabulum, which seated nicely, and then impacted the real cup into place.  Appropriate version and inclination was confirmed clinically matching their bony anatomy, and also with the use of the jig.  I placed a cancellous screw to augment fixation.  A trial  polyethylene liner was placed and the wing retractor removed.    I then prepared the proximal femur using the cookie-cutter, the lateralizing reamer, and then sequentially reamed and broached.  A trial broach, neck, and head was utilized, and I reduced the hip and it was found to have excellent stability with functional range of motion. The trial components were then removed, and the real polyethylene liner was placed.  I then impacted the real femoral prosthesis into place into the appropriate version, slightly anteverted to the normal anatomy, and I impacted the real head ball into place. The hip was then reduced and taken through functional range of motion and found to have excellent stability. Leg lengths were restored.  I then used a 2 mm drill bits to pass the a Ethibond suture from the capsule and piriformis through the greater trochanter, and secured this.  The tissues did not quite advance to the bone.  I also closed the T in the capsule.  I then irrigated the hip copiously again with pulse lavage, and repaired the fascia with Vicryl, followed by Vicryl for the subcutaneous tissue, Monocryl for the skin, Steri-Strips and sterile gauze. The wounds were injected. The patient was then awakened and returned to PACU in stable and satisfactory condition. There were no complications.  Marchia Bond, MD Orthopedic Surgeon [phone]   06/20/2022 9:35 AM

## 2022-06-20 NOTE — Transfer of Care (Signed)
Immediate Anesthesia Transfer of Care Note  Patient: Suzanne Barton  Procedure(s) Performed: TOTAL HIP ARTHROPLASTY (Left: Hip)  Patient Location: PACU  Anesthesia Type:Spinal and MAC combined with regional for post-op pain  Level of Consciousness: awake, alert , oriented and patient cooperative  Airway & Oxygen Therapy: Patient Spontanous Breathing  Post-op Assessment: Report given to RN and Post -op Vital signs reviewed and stable  Post vital signs: Reviewed and stable  Last Vitals:  Vitals Value Taken Time  BP 129/49 06/20/22 0953  Temp    Pulse 72 06/20/22 0956  Resp 26 06/20/22 0956  SpO2 100 % 06/20/22 0956  Vitals shown include unvalidated device data.  Last Pain:  Vitals:   06/20/22 0554  TempSrc: Oral  PainSc:       Patients Stated Pain Goal: 4 (10/22/13 5208)  Complications: No notable events documented.

## 2022-06-20 NOTE — Interval H&P Note (Signed)
History and Physical Interval Note:  06/20/2022 7:16 AM  Suzanne Barton  has presented today for surgery, with the diagnosis of djd left hip.  The various methods of treatment have been discussed with the patient and family. After consideration of risks, benefits and other options for treatment, the patient has consented to  Procedure(s): TOTAL HIP ARTHROPLASTY (Left) as a surgical intervention.  The patient's history has been reviewed, patient examined, no change in status, stable for surgery.  I have reviewed the patient's chart and labs.  Questions were answered to the patient's satisfaction.     Johnny Bridge

## 2022-06-20 NOTE — Discharge Instructions (Signed)

## 2022-06-20 NOTE — Anesthesia Postprocedure Evaluation (Signed)
Anesthesia Post Note  Patient: Suzanne Barton  Procedure(s) Performed: TOTAL HIP ARTHROPLASTY (Left: Hip)     Patient location during evaluation: PACU Anesthesia Type: Spinal Level of consciousness: awake and alert Pain management: pain level controlled Vital Signs Assessment: post-procedure vital signs reviewed and stable Respiratory status: spontaneous breathing, nonlabored ventilation and respiratory function stable Cardiovascular status: blood pressure returned to baseline Postop Assessment: no apparent nausea or vomiting, spinal receding, no headache and no backache Anesthetic complications: no Comments: Patient seen in PACU for complaint of throat pain/ "something stuck in my throat". Patient states she feels a "lump in my throat". No respiratory concerns. SpO2 100%, lungs CTAB. Small area of erythema on right hard palate seen (?trauma from OPA). Patient reassured that no objective problems observed and counseled that throat pain would likely improve over hours to a couple days. Daiva Huge, MD   No notable events documented.  Last Vitals:  Vitals:   06/20/22 1100 06/20/22 1119  BP: 121/62 (!) 142/46  Pulse: 65 61  Resp: 13 20  Temp:    SpO2: 99% 96%    Last Pain:  Vitals:   06/20/22 1037  TempSrc:   PainSc: [place]

## 2022-06-20 NOTE — Evaluation (Addendum)
Physical Therapy Evaluation Patient Details Name: Suzanne Barton MRN: [id number] DOB: [birth year] Today's Date: 06/20/2022  History of Present Illness  Pt is a 71yo female presenting s/p L-THA, posterior approach on 06/20/22; no formal hip precautions. PMH: GERD, hepatitis C, HLD, HTN, PNA, R femur IM nail 2017.   Clinical Impression  Suzanne Barton is a 71 y.o. female POD 0 s/p L-THA. Patient reports independence with mobility at baseline. Patient is now limited by functional impairments (see PT problem list below) and requires supervision for bed mobility and min guard for transfers. Patient was able to ambulate 10 feet with RW and min guard level of assist, distance limited by pain, further mobility deferred, VSS, RN notified. Patient instructed in exercise to facilitate ROM and circulation to manage edema. Patient will benefit from continued skilled PT interventions to address impairments and progress towards PLOF.  Acute PT will follow to progress mobility and stair training in preparation for safe discharge home.      Recommendations for follow up therapy are one component of a multi-disciplinary discharge planning process, led by the attending physician.  Recommendations may be updated based on patient status, additional functional criteria and insurance authorization.  Follow Up Recommendations Follow physician's recommendations for discharge plan and follow up therapies      Assistance Recommended at Discharge Frequent or constant Supervision/Assistance  Patient can return home with the following  A little help with walking and/or transfers;A little help with bathing/dressing/bathroom;Assistance with cooking/housework;Help with stairs or ramp for entrance;Assist for transportation    Equipment Recommendations None recommended by PT  Recommendations for Other Services       Functional Status Assessment Patient has had a recent decline in their functional status and  demonstrates the ability to make significant improvements in function in a reasonable and predictable amount of time.     Precautions / Restrictions Precautions Precautions: Fall Precaution Comments: Pt has recent fall in her history Restrictions Weight Bearing Restrictions: No Other Position/Activity Restrictions: wbat      Mobility  Bed Mobility Overal bed mobility: Needs Assistance Bed Mobility: Supine to Sit     Supine to sit: Min assist     General bed mobility comments: Min assist to bring LLE off bed    Transfers Overall transfer level: Needs assistance Equipment used: Rolling walker (2 wheels) Transfers: Sit to/from Stand Sit to Stand: Min assist           General transfer comment: Min assist for steadying of RW, no lift assist required. Pt able to perform static stance with ony min guard.    Ambulation/Gait Ambulation/Gait assistance: Min guard Gait Distance (Feet): 10 Feet Assistive device: Rolling walker (2 wheels) Gait Pattern/deviations: Step-to pattern, Decreased step length - left, Decreased stance time - left, Antalgic Gait velocity: decreased     General Gait Details: Pt ambulated with RW and min guard, no physical assist required or overt LOB, distance limited by pain, pt required early termination of exercise, directed pt to sit in recliner, BP 121/53, Spo298%, HR 72. Pain "12/10 in the upper half of my leg." RN notified.  Stairs            Wheelchair Mobility    Modified Rankin (Stroke Patients Only)       Balance Overall balance assessment: Needs assistance Sitting-balance support: No upper extremity supported, Feet supported Sitting balance-Leahy Scale: Good     Standing balance support: Reliant on assistive device for balance, During functional activity, Bilateral upper extremity supported Standing  balance-Leahy Scale: Poor                               Pertinent Vitals/Pain Pain Assessment Pain Assessment:  0-10 Pain Score: 7  Pain Location: left hip and knee Pain Descriptors / Indicators: Aching Pain Intervention(s): Limited activity within patient's tolerance, Monitored during session, Repositioned, Ice applied    Home Living Family/patient expects to be discharged to:: Private residence Living Arrangements: Spouse/significant other Available Help at Discharge: Family;Available 24 hours/day Type of Home: House Home Access: Stairs to enter Entrance Stairs-Rails: Right Entrance Stairs-Number of Steps: 3   Home Layout: One level Home Equipment: Conservation officer, nature (2 wheels);Wheelchair - manual;Shower seat      Prior Function Prior Level of Function : Independent/Modified Independent;History of Falls (last six months)             Mobility Comments: Falls: fell two weeks ago at the dog park, Dr. Mardelle Matte cleared. IND ADLs Comments: IND     Hand Dominance   Dominant Hand: Right    Extremity/Trunk Assessment   Upper Extremity Assessment Upper Extremity Assessment: Overall WFL for tasks assessed    Lower Extremity Assessment Lower Extremity Assessment: RLE deficits/detail;LLE deficits/detail RLE Deficits / Details: MMT ank DF/PF 5/5 RLE Sensation: WNL LLE Deficits / Details: MMT ank DF/PF 5/5 LLE Sensation: WNL    Cervical / Trunk Assessment Cervical / Trunk Assessment: Kyphotic  Communication   Communication: No difficulties  Cognition Arousal/Alertness: Awake/alert Behavior During Therapy: WFL for tasks assessed/performed Overall Cognitive Status: Within Functional Limits for tasks assessed                                          General Comments General comments (skin integrity, edema, etc.): Husband Magda Paganini present    Exercises Total Joint Exercises Ankle Circles/Pumps: AROM, Both, 10 reps   Assessment/Plan    PT Assessment Patient needs continued PT services  PT Problem List Decreased strength;Decreased range of motion;Decreased activity  tolerance;Decreased balance;Decreased mobility;Decreased coordination;Pain       PT Treatment Interventions DME instruction;Gait training;Stair training;Functional mobility training;Therapeutic activities;Therapeutic exercise;Balance training;Neuromuscular re-education;Patient/family education    PT Goals (Current goals can be found in the Care Plan section)  Acute Rehab PT Goals Patient Stated Goal: Return to dog park PT Goal Formulation: With patient Time For Goal Achievement: 06/27/22 Potential to Achieve Goals: Good    Frequency 7X/week     Co-evaluation               AM-PAC PT "6 Clicks" Mobility  Outcome Measure Help needed turning from your back to your side while in a flat bed without using bedrails?: None Help needed moving from lying on your back to sitting on the side of a flat bed without using bedrails?: A Little Help needed moving to and from a bed to a chair (including a wheelchair)?: A Little Help needed standing up from a chair using your arms (e.g., wheelchair or bedside chair)?: A Little Help needed to walk in hospital room?: A Little Help needed climbing 3-5 steps with a railing? : A Little 6 Click Score: 19    End of Session Equipment Utilized During Treatment: Gait belt Activity Tolerance: Patient limited by pain Patient left: in chair;with call bell/phone within reach;with family/visitor present Nurse Communication: Mobility status PT Visit Diagnosis: Pain;History of falling (Z91.81);Difficulty in  walking, not elsewhere classified (R26.2) Pain - Right/Left: Left Pain - part of body: Hip;Knee    Time: 1255-1320 PT Time Calculation (min) (ACUTE ONLY): 25 min   Charges:   PT Evaluation $PT Eval Low Complexity: 1 Low          Coolidge Breeze, PT, DPT [facility] Department Office: [phone] Weekend pager: [phone]  Coolidge Breeze 06/20/2022, 1:31 PM

## 2022-06-21 ENCOUNTER — Encounter (HOSPITAL_COMMUNITY): Payer: Self-pay | Admitting: Orthopedic Surgery

## 2022-06-21 DIAGNOSIS — I10 Essential (primary) hypertension: Secondary | ICD-10-CM | POA: Diagnosis not present

## 2022-06-21 DIAGNOSIS — Z7982 Long term (current) use of aspirin: Secondary | ICD-10-CM | POA: Diagnosis not present

## 2022-06-21 DIAGNOSIS — Z79899 Other long term (current) drug therapy: Secondary | ICD-10-CM | POA: Diagnosis not present

## 2022-06-21 DIAGNOSIS — Z7722 Contact with and (suspected) exposure to environmental tobacco smoke (acute) (chronic): Secondary | ICD-10-CM | POA: Diagnosis not present

## 2022-06-21 DIAGNOSIS — M1612 Unilateral primary osteoarthritis, left hip: Secondary | ICD-10-CM | POA: Diagnosis not present

## 2022-06-21 LAB — CBC
HCT: 34.3 % — ABNORMAL LOW (ref 36.0–46.0)
Hemoglobin: 11.5 g/dL — ABNORMAL LOW (ref 12.0–15.0)
MCH: 30.3 pg (ref 26.0–34.0)
MCHC: 33.5 g/dL (ref 30.0–36.0)
MCV: 90.5 fL (ref 80.0–100.0)
Platelets: 148 10*3/uL — ABNORMAL LOW (ref 150–400)
RBC: 3.79 MIL/uL — ABNORMAL LOW (ref 3.87–5.11)
RDW: 12.5 % (ref 11.5–15.5)
WBC: 9.6 10*3/uL (ref 4.0–10.5)
nRBC: 0 % (ref 0.0–0.2)

## 2022-06-21 LAB — BASIC METABOLIC PANEL
Anion gap: 5 (ref 5–15)
BUN: 18 mg/dL (ref 8–23)
CO2: 23 mmol/L (ref 22–32)
Calcium: 8.5 mg/dL — ABNORMAL LOW (ref 8.9–10.3)
Chloride: 103 mmol/L (ref 98–111)
Creatinine, Ser: 1.13 mg/dL — ABNORMAL HIGH (ref 0.44–1.00)
GFR, Estimated: 53 mL/min — ABNORMAL LOW (ref >60)
Glucose, Bld: 130 mg/dL — ABNORMAL HIGH (ref 70–99)
Potassium: 5.4 mmol/L — ABNORMAL HIGH (ref 3.5–5.1)
Sodium: 131 mmol/L — ABNORMAL LOW (ref 135–145)

## 2022-06-21 NOTE — Progress Notes (Signed)
Physical Therapy Treatment Patient Details Name: Suzanne Barton MRN: [id number] DOB: 1951/02/25 Today's Date: 06/21/2022   History of Present Illness Pt is a 71yo female presenting s/p L-THA, posterior approach on 06/20/22; no formal hip precautions. PMH: GERD, hepatitis C, HLD, HTN, PNA, R femur IM nail 2017.    PT Comments    Pt ambulated 14' with RW, distance limited by L hip and L "tail bone" pain. Pt reports she fell a couple weeks ago and has had tail bone pain since then. Instructed pt in HEP, she demonstrates good understanding. I will return for a second session for stair training, then I expect she'll be ready to DC home.     Recommendations for follow up therapy are one component of a multi-disciplinary discharge planning process, led by the attending physician.  Recommendations may be updated based on patient status, additional functional criteria and insurance authorization.  Follow Up Recommendations  Follow physician's recommendations for discharge plan and follow up therapies     Assistance Recommended at Discharge Intermittent Supervision/Assistance  Patient can return home with the following A little help with walking and/or transfers;A little help with bathing/dressing/bathroom;Assistance with cooking/housework;Help with stairs or ramp for entrance;Assist for transportation   Equipment Recommendations  None recommended by PT    Recommendations for Other Services       Precautions / Restrictions Precautions Precautions: Fall Precaution Comments: pt fell a couple weeks ago and has "tail bone" pain from this, she reports xrays did not show a fx Restrictions Weight Bearing Restrictions: No LLE Weight Bearing: Weight bearing as tolerated Other Position/Activity Restrictions: wbat     Mobility  Bed Mobility Overal bed mobility: Needs Assistance Bed Mobility: Supine to Sit     Supine to sit: Min assist, HOB elevated     General bed mobility comments:  min A to raise trunk    Transfers Overall transfer level: Needs assistance Equipment used: Rolling walker (2 wheels) Transfers: Sit to/from Stand Sit to Stand: Min guard           General transfer comment: VCs hand placement    Ambulation/Gait Ambulation/Gait assistance: Min guard Gait Distance (Feet): 90 Feet Assistive device: Rolling walker (2 wheels) Gait Pattern/deviations: Step-to pattern, Decreased step length - left, Decreased stance time - left, Antalgic Gait velocity: decreased     General Gait Details: steady with RW, no loss of balance, distance limited by pain   Stairs             Wheelchair Mobility    Modified Rankin (Stroke Patients Only)       Balance     Sitting balance-Leahy Scale: Good     Standing balance support: During functional activity, Reliant on assistive device for balance, Bilateral upper extremity supported Standing balance-Leahy Scale: Fair                              Cognition Arousal/Alertness: Awake/alert Behavior During Therapy: WFL for tasks assessed/performed Overall Cognitive Status: Within Functional Limits for tasks assessed                                          Exercises Total Joint Exercises Ankle Circles/Pumps: AROM, Both, 10 reps Quad Sets: AROM, Left, 5 reps, Supine Short Arc Quad: AROM, Left, 10 reps, Supine Heel Slides: AAROM, Left, 10 reps, Supine Hip ABduction/ADduction:  AAROM, Left, 10 reps, Supine Long Arc Quad: AROM, Left, 10 reps, Seated    General Comments        Pertinent Vitals/Pain Pain Assessment Pain Score: 10-Worst pain ever Pain Location: L hip and "tail bone" Pain Descriptors / Indicators: Sore Pain Intervention(s): Limited activity within patient's tolerance, Monitored during session, Patient requesting pain meds-RN notified    Home Living                          Prior Function            PT Goals (current goals can now be  found in the care plan section) Acute Rehab PT Goals Patient Stated Goal: Return to dog park with 80 pound dog "Gus" PT Goal Formulation: With patient Time For Goal Achievement: 06/27/22 Potential to Achieve Goals: Good Progress towards PT goals: Progressing toward goals    Frequency    7X/week      PT Plan Current plan remains appropriate    Co-evaluation              AM-PAC PT "6 Clicks" Mobility   Outcome Measure  Help needed turning from your back to your side while in a flat bed without using bedrails?: None Help needed moving from lying on your back to sitting on the side of a flat bed without using bedrails?: A Little Help needed moving to and from a bed to a chair (including a wheelchair)?: A Little Help needed standing up from a chair using your arms (e.g., wheelchair or bedside chair)?: None Help needed to walk in hospital room?: None Help needed climbing 3-5 steps with a railing? : A Little 6 Click Score: 21    End of Session Equipment Utilized During Treatment: Gait belt Activity Tolerance: Patient limited by pain Patient left: in chair;with call bell/phone within reach Nurse Communication: Mobility status PT Visit Diagnosis: Pain;History of falling (Z91.81);Difficulty in walking, not elsewhere classified (R26.2) Pain - Right/Left: Left Pain - part of body: Hip     Time: 6269-4854 PT Time Calculation (min) (ACUTE ONLY): 30 min  Charges:  $Gait Training: 8-22 mins $Therapeutic Exercise: 8-22 mins                     Blondell Reveal Kistler PT 06/21/2022  Acute Rehabilitation Services  Office [phone]

## 2022-06-21 NOTE — Progress Notes (Signed)
Patient was given discharge instructions, and all questions were answered.  Patient was stable for discharge and was taken to the main exit by wheelchair. 

## 2022-06-21 NOTE — TOC Transition Note (Signed)
Transition of Care [facility]) - CM/SW Discharge Note   Patient Details  Name: Suzanne Barton MRN: [id number] Date of Birth: 12-26-1950  [facility] CM/SW Contact:  Lennart Pall, LCSW Phone Number: 06/21/2022, 12:20 PM   Clinical Narrative:    Met with pt and confirming she has all needed DME at home.  OPPT already arranged with [facility].  No TOC needs.   Final next level of care: OP Rehab Barriers to Discharge: No Barriers Identified   Patient Goals and CMS Choice Patient states their goals for this hospitalization and ongoing recovery are:: return home      Discharge Placement                       Discharge Plan and Services                DME Arranged: N/A DME Agency: NA                  Social Determinants of Health (SDOH) Interventions     Readmission Risk Interventions    06/21/2022   12:19 PM  Readmission Risk Prevention Plan  Post Dischage Appt Complete  Medication Screening Complete  Transportation Screening Complete

## 2022-06-21 NOTE — Progress Notes (Signed)
Subjective: 1 Day Post-Op s/p Procedure(s): TOTAL HIP ARTHROPLASTY   Patient is alert, oriented. Overall not feeling like herself yet.  Reports pain as well controlled overnight with oxycodone and dilaudid. No bowel movement yet, not yet passing flatus.  Denies chest pain, SOB, Calf pain. No nausea/vomiting. No other complaints. Some globus sensation, she did have some regurgitation of stomach contents while waking up from anesthesia. Voiding, but "only in dribbles", pure wick in place.    Objective:  PE: VITALS:   Vitals:   06/20/22 2025 06/20/22 2120 06/21/22 0113 06/21/22 0513  BP: (!) 118/53 (!) 105/59 139/60 (!) 129/47  Pulse: 65 (!) 59 63 64  Resp: '18 18 18 18  '$ Temp: 98 F (36.7 C) 98.1 F (36.7 C) 97.7 F (36.5 C) 97.7 F (36.5 C)  TempSrc: Oral Oral Oral Oral  SpO2: 100% 98% 97% 98%  Weight:      Height:       General: sitting up in bed, in no acute distress Resp: normal respiratory effort MSK: EHL and FHL intact. Distal sensation intact. + DP pulse. Dressing CDI.   LABS  Results for orders placed or performed during the hospital encounter of 06/20/22 (from the past 24 hour(s))  Basic metabolic panel     Status: Abnormal   Collection Time: 06/20/22 10:10 AM  Result Value Ref Range   Sodium 136 135 - 145 mmol/L   Potassium 3.9 3.5 - 5.1 mmol/L   Chloride 104 98 - 111 mmol/L   CO2 26 22 - 32 mmol/L   Glucose, Bld 81 70 - 99 mg/dL   BUN 18 8 - 23 mg/dL   Creatinine, Ser 1.25 (H) 0.44 - 1.00 mg/dL   Calcium 9.0 8.9 - 10.3 mg/dL   GFR, Estimated 47 (L) >60 mL/min   Anion gap 6 5 - 15  CBC     Status: Abnormal   Collection Time: 06/21/22  4:13 AM  Result Value Ref Range   WBC 9.6 4.0 - 10.5 K/uL   RBC 3.79 (L) 3.87 - 5.11 MIL/uL   Hemoglobin 11.5 (L) 12.0 - 15.0 g/dL   HCT 34.3 (L) 36.0 - 46.0 %   MCV 90.5 80.0 - 100.0 fL   MCH 30.3 26.0 - 34.0 pg   MCHC 33.5 30.0 - 36.0 g/dL   RDW 12.5 11.5 - 15.5 %   Platelets 148 (L) 150 - 400 K/uL   nRBC 0.0  0.0 - 0.2 %  Basic metabolic panel     Status: Abnormal   Collection Time: 06/21/22  4:13 AM  Result Value Ref Range   Sodium 131 (L) 135 - 145 mmol/L   Potassium 5.4 (H) 3.5 - 5.1 mmol/L   Chloride 103 98 - 111 mmol/L   CO2 23 22 - 32 mmol/L   Glucose, Bld 130 (H) 70 - 99 mg/dL   BUN 18 8 - 23 mg/dL   Creatinine, Ser 1.13 (H) 0.44 - 1.00 mg/dL   Calcium 8.5 (L) 8.9 - 10.3 mg/dL   GFR, Estimated 53 (L) >60 mL/min   Anion gap 5 5 - 15    DG Hip Port Unilat With Pelvis 1V Left  Result Date: 06/20/2022 CLINICAL DATA:  Postop left total hip replacement EXAM: DG HIP (WITH OR WITHOUT PELVIS) 1V PORT LEFT COMPARISON:  02/10/2016 pelvis radiographs and 02/11/2016 femur fluoroscopy FINDINGS: Status post left total hip arthroplasty. Arthroplasty components are in near anatomic alignment. No periprosthetic lucency or fracture. Redemonstrated right proximal femoral intramedullary  nail fixation. IMPRESSION: Status post left total hip arthroplasty without evidence of hardware complication. Electronically Signed   By: Merilyn Baba M.D.   On: 06/20/2022 11:12    Assessment/Plan: Principal Problem:   S/P total left hip arthroplasty  1 Day Post-Op s/p Procedure(s): TOTAL HIP ARTHROPLASTY  Weightbearing: WBAT LLE Insicional and dressing care: Reinforce dressings as needed VTE prophylaxis: Aspirin '325mg'$  BID x 30 days Pain control: continue current regimen, prioritize oral narcotics over IV Follow - up plan: 2 weeks with Dr. Mardelle Matte Dispo: pending progress with PT today, will reassess after second session.   Contact information:   Merlene Pulling, Hershal Coria [id number]  After hours and holidays please check [email or web address] for group call information for [facility]  Ventura Bruns 06/21/2022, 8:45 AM

## 2022-06-21 NOTE — Progress Notes (Signed)
Physical Therapy Treatment Patient Details Name: Suzanne Barton MRN: [id number] DOB: 1951-08-21 Today's Date: 06/21/2022   History of Present Illness Pt is a 71yo female presenting s/p L-THA, posterior approach on 06/20/22; no formal hip precautions. PMH: GERD, hepatitis C, HLD, HTN, PNA, R femur IM nail 2017.    PT Comments    Pt is progressing well with mobility, she tolerated increased ambulation distance of 140' without loss of balance. Stair training completed with spouse present and assisting with management of RW. She is ready to DC home from a PT standpoint.     Recommendations for follow up therapy are one component of a multi-disciplinary discharge planning process, led by the attending physician.  Recommendations may be updated based on patient status, additional functional criteria and insurance authorization.  Follow Up Recommendations  Follow physician's recommendations for discharge plan and follow up therapies     Assistance Recommended at Discharge Intermittent Supervision/Assistance  Patient can return home with the following A little help with walking and/or transfers;A little help with bathing/dressing/bathroom;Assistance with cooking/housework;Help with stairs or ramp for entrance;Assist for transportation   Equipment Recommendations  None recommended by PT    Recommendations for Other Services       Precautions / Restrictions Precautions Precautions: Fall Precaution Comments: pt fell a couple weeks ago and has "tail bone" pain from this, she reports xrays did not show a fx Restrictions Weight Bearing Restrictions: No LLE Weight Bearing: Weight bearing as tolerated Other Position/Activity Restrictions: wbat     Mobility  Bed Mobility Overal bed mobility: Needs Assistance Bed Mobility: Supine to Sit     Supine to sit: Min assist, HOB elevated     General bed mobility comments: min A to raise trunk    Transfers Overall transfer level:  Modified independent Equipment used: Rolling walker (2 wheels) Transfers: Sit to/from Stand Sit to Stand: Modified independent (Device/Increase time)           General transfer comment: VCs hand placement    Ambulation/Gait Ambulation/Gait assistance: Modified independent (Device/Increase time) Gait Distance (Feet): 140 Feet Assistive device: Rolling walker (2 wheels) Gait Pattern/deviations: Step-to pattern, Decreased step length - left, Decreased stance time - left Gait velocity: decreased     General Gait Details: steady with RW, no loss of balance   Stairs Stairs: Yes   Stair Management: Step to pattern, One rail Right, Forwards, With cane Number of Stairs: 5 General stair comments: 3 + 2 stairs, spouse present and assisted with management of RW,  VCs sequencing   Wheelchair Mobility    Modified Rankin (Stroke Patients Only)       Balance     Sitting balance-Leahy Scale: Good     Standing balance support: During functional activity, Reliant on assistive device for balance, Bilateral upper extremity supported Standing balance-Leahy Scale: Fair                              Cognition Arousal/Alertness: Awake/alert Behavior During Therapy: WFL for tasks assessed/performed Overall Cognitive Status: Within Functional Limits for tasks assessed                                          Exercises Total Joint Exercises Ankle Circles/Pumps: AROM, Both, 10 reps Quad Sets: AROM, Left, 5 reps, Supine Short Arc Quad: AROM, Left, 10 reps, Supine Heel Slides:  AAROM, Left, 10 reps, Supine Hip ABduction/ADduction: AAROM, Left, 10 reps, Supine Long Arc Quad: AROM, Left, 10 reps, Seated    General Comments        Pertinent Vitals/Pain Pain Assessment Pain Score: 9  Pain Location: L hip Pain Descriptors / Indicators: Sore Pain Intervention(s): Limited activity within patient's tolerance, Monitored during session, Premedicated before  session, Ice applied    Home Living                          Prior Function            PT Goals (current goals can now be found in the care plan section) Acute Rehab PT Goals Patient Stated Goal: Return to dog park with 80 pound dog "Gus" PT Goal Formulation: With patient Time For Goal Achievement: 06/27/22 Potential to Achieve Goals: Good Progress towards PT goals: Goals met/education completed, patient discharged from PT    Frequency    7X/week      PT Plan Current plan remains appropriate    Co-evaluation              AM-PAC PT "6 Clicks" Mobility   Outcome Measure  Help needed turning from your back to your side while in a flat bed without using bedrails?: None Help needed moving from lying on your back to sitting on the side of a flat bed without using bedrails?: A Little Help needed moving to and from a bed to a chair (including a wheelchair)?: None Help needed standing up from a chair using your arms (e.g., wheelchair or bedside chair)?: None Help needed to walk in hospital room?: None Help needed climbing 3-5 steps with a railing? : A Little 6 Click Score: 22    End of Session Equipment Utilized During Treatment: Gait belt Activity Tolerance: Patient tolerated treatment well Patient left: in chair;with call bell/phone within reach;with family/visitor present Nurse Communication: Mobility status PT Visit Diagnosis: Pain;History of falling (Z91.81);Difficulty in walking, not elsewhere classified (R26.2) Pain - Right/Left: Left Pain - part of body: Hip     Time: 7703-4035 PT Time Calculation (min) (ACUTE ONLY): 20 min  Charges:  $Gait Training: 8-22 mins

## 2022-06-22 NOTE — Discharge Summary (Signed)
Discharge Summary  Patient ID: ARLISHA PATALANO MRN: [id number] DOB/AGE: 11/23/1950 71 y.o.  Admit date: 06/20/2022 Discharge date: 06/21/2022  Admission Diagnoses:  S/P total left hip arthroplasty  Discharge Diagnoses:  Principal Problem:   S/P total left hip arthroplasty   Past Medical History:  Diagnosis Date   Allergy    Anemia    Arthritis    HIPS,KNEES   Blood transfusion    X 2   Femur fracture, right ([place]) 02/10/2016   GERD (gastroesophageal reflux disease)    Hepatitis C 1992   in remission   Hyperlipidemia    no meds   Hypertension    Neuromuscular disorder ([place])    hiatal hernia   Pneumonia    Right patella fracture    Seasonal allergies    Thyroid disease    "MILD"    Surgeries: Procedure(s): TOTAL HIP ARTHROPLASTY on 06/20/2022   Consultants (if any):   Discharged Condition: Improved  Hospital Course: Quanna Wittke Smithey is an 71 y.o. female who was admitted 06/20/2022 with a diagnosis of S/P total left hip arthroplasty and went to the operating room on 06/20/2022 and underwent the above named procedures.    She was given perioperative antibiotics:  Anti-infectives (From admission, onward)    Start     Dose/Rate Route Frequency Ordered Stop   06/20/22 1443  ceFAZolin (ANCEF) 2-4 GM/100ML-% IVPB       Note to Pharmacy: Valinda Party C: cabinet override      06/20/22 1443 06/21/22 0259   06/20/22 1400  ceFAZolin (ANCEF) IVPB 2g/100 mL premix        2 g 200 mL/hr over 30 Minutes Intravenous Every 6 hours 06/20/22 1012 06/21/22 1355   06/20/22 0600  ceFAZolin (ANCEF) IVPB 2g/100 mL premix        2 g 200 mL/hr over 30 Minutes Intravenous On call to O.R. 06/20/22 0277 06/20/22 0805     .  She was given sequential compression devices, early ambulation, and aspirin for DVT prophylaxis.  She benefited maximally from the hospital stay and there were no complications.    Recent vital signs:  Vitals:   06/21/22 0513 06/21/22 0944  BP: (!)  129/47 (!) 97/56  Pulse: 64 61  Resp: 18 18  Temp: 97.7 F (36.5 C) 97.9 F (36.6 C)  SpO2: 98% 100%    Recent laboratory studies:  Lab Results  Component Value Date   HGB 11.5 (L) 06/21/2022   HGB 14.3 06/07/2022   HGB 8.6 (L) 02/13/2016   Lab Results  Component Value Date   WBC 9.6 06/21/2022   PLT 148 (L) 06/21/2022   No results found for: "INR" Lab Results  Component Value Date   NA 131 (L) 06/21/2022   K 5.4 (H) 06/21/2022   CL 103 06/21/2022   CO2 23 06/21/2022   BUN 18 06/21/2022   CREATININE 1.13 (H) 06/21/2022   GLUCOSE 130 (H) 06/21/2022    Discharge Medications:   Allergies as of 06/21/2022       Reactions   Losartan Potassium-hctz Shortness Of Breath   Rash   Tape    Redness    Tussionex Pennkinetic Er [hydrocod Poli-chlorphe Poli Er] Hives   Amoxicillin Rash   2 GM CEFAZOLIN ADMINISTERED WITHOUT ANY DIFFICULTIES OR REACTIONS 02/10/2016 @ 1900   Ibuprofen Rash   Tolerates ASA   Sulfa Antibiotics Rash        Medication List     STOP taking these medications  aspirin 81 MG chewable tablet Commonly known as: Aspirin Childrens Replaced by: aspirin EC 325 MG tablet   naproxen sodium 220 MG tablet Commonly known as: ALEVE       TAKE these medications    aspirin EC 325 MG tablet Take 1 tablet (325 mg total) by mouth 2 (two) times daily. Replaces: aspirin 81 MG chewable tablet   atenolol 25 MG tablet Commonly known as: TENORMIN Take 0.5 tablets (12.5 mg total) by mouth daily. What changed: when to take this   cetirizine 10 MG tablet Commonly known as: ZYRTEC Take 10 mg by mouth daily.   ezetimibe-simvastatin 10-40 MG tablet Commonly known as: VYTORIN TAKE ONE TABLET BY MOUTH ONCE DAILY AT 6:00 P.M. What changed: See the new instructions.   methocarbamol 500 MG tablet Commonly known as: ROBAXIN Take 1 tablet (500 mg total) by mouth every 8 (eight) hours as needed for muscle spasms.   montelukast 10 MG tablet Commonly known  as: SINGULAIR Take 10 mg by mouth daily.   ondansetron 4 MG tablet Commonly known as: Zofran Take 1 tablet (4 mg total) by mouth every 8 (eight) hours as needed for nausea or vomiting.   oxyCODONE 5 MG immediate release tablet Commonly known as: Roxicodone Take 1 tablet (5 mg total) by mouth every 4 (four) hours as needed for severe pain.   pantoprazole 40 MG tablet Commonly known as: PROTONIX Take 40 mg by mouth daily.   REFRESH TEARS OP Place 1-2 drops into both eyes daily as needed (dry eyes).   sennosides-docusate sodium 8.6-50 MG tablet Commonly known as: SENOKOT-S Take 2 tablets by mouth daily.   spironolactone 25 MG tablet Commonly known as: ALDACTONE TAKE 1 TABLET BY MOUTH IN THE MORNING   Vitamin D (Ergocalciferol) 1.25 MG (50000 UNIT) Caps capsule Commonly known as: DRISDOL Take 50,000 Units by mouth every Thursday.        Diagnostic Studies: DG Hip Port Unilat With Pelvis 1V Left  Result Date: 06/20/2022 CLINICAL DATA:  Postop left total hip replacement EXAM: DG HIP (WITH OR WITHOUT PELVIS) 1V PORT LEFT COMPARISON:  02/10/2016 pelvis radiographs and 02/11/2016 femur fluoroscopy FINDINGS: Status post left total hip arthroplasty. Arthroplasty components are in near anatomic alignment. No periprosthetic lucency or fracture. Redemonstrated right proximal femoral intramedullary nail fixation. IMPRESSION: Status post left total hip arthroplasty without evidence of hardware complication. Electronically Signed   By: Merilyn Baba M.D.   On: 06/20/2022 11:12    Disposition: Discharge disposition: 01-Home or Self Care          Follow-up Information     Marchia Bond, MD. Schedule an appointment as soon as possible for a visit in 2 week(s).   Specialty: Orthopedic Surgery Contact information: [address] [place] [address] [phone]                  Signed: Jola Baptist 06/22/2022, 9:53 AM

## 2022-06-26 DIAGNOSIS — Z96642 Presence of left artificial hip joint: Secondary | ICD-10-CM | POA: Diagnosis not present

## 2022-06-26 DIAGNOSIS — M6281 Muscle weakness (generalized): Secondary | ICD-10-CM | POA: Diagnosis not present

## 2022-06-26 DIAGNOSIS — R269 Unspecified abnormalities of gait and mobility: Secondary | ICD-10-CM | POA: Diagnosis not present

## 2022-06-28 DIAGNOSIS — R269 Unspecified abnormalities of gait and mobility: Secondary | ICD-10-CM | POA: Diagnosis not present

## 2022-06-28 DIAGNOSIS — Z96642 Presence of left artificial hip joint: Secondary | ICD-10-CM | POA: Diagnosis not present

## 2022-06-28 DIAGNOSIS — M6281 Muscle weakness (generalized): Secondary | ICD-10-CM | POA: Diagnosis not present

## 2022-07-03 DIAGNOSIS — R269 Unspecified abnormalities of gait and mobility: Secondary | ICD-10-CM | POA: Diagnosis not present

## 2022-07-03 DIAGNOSIS — M6281 Muscle weakness (generalized): Secondary | ICD-10-CM | POA: Diagnosis not present

## 2022-07-03 DIAGNOSIS — Z96642 Presence of left artificial hip joint: Secondary | ICD-10-CM | POA: Diagnosis not present

## 2022-07-05 DIAGNOSIS — M1612 Unilateral primary osteoarthritis, left hip: Secondary | ICD-10-CM | POA: Diagnosis not present

## 2022-07-10 DIAGNOSIS — R269 Unspecified abnormalities of gait and mobility: Secondary | ICD-10-CM | POA: Diagnosis not present

## 2022-07-10 DIAGNOSIS — M6281 Muscle weakness (generalized): Secondary | ICD-10-CM | POA: Diagnosis not present

## 2022-07-10 DIAGNOSIS — Z96642 Presence of left artificial hip joint: Secondary | ICD-10-CM | POA: Diagnosis not present

## 2022-07-11 ENCOUNTER — Other Ambulatory Visit: Payer: Self-pay

## 2022-07-11 DIAGNOSIS — I10 Essential (primary) hypertension: Secondary | ICD-10-CM

## 2022-07-11 MED ORDER — SPIRONOLACTONE 25 MG PO TABS: 25.0000 mg | ORAL_TABLET | Freq: Every morning | ORAL | 2 refills | Status: DC

## 2022-07-12 DIAGNOSIS — Z96642 Presence of left artificial hip joint: Secondary | ICD-10-CM | POA: Diagnosis not present

## 2022-07-12 DIAGNOSIS — R269 Unspecified abnormalities of gait and mobility: Secondary | ICD-10-CM | POA: Diagnosis not present

## 2022-07-12 DIAGNOSIS — M6281 Muscle weakness (generalized): Secondary | ICD-10-CM | POA: Diagnosis not present

## 2022-07-19 DIAGNOSIS — R269 Unspecified abnormalities of gait and mobility: Secondary | ICD-10-CM | POA: Diagnosis not present

## 2022-07-19 DIAGNOSIS — Z96642 Presence of left artificial hip joint: Secondary | ICD-10-CM | POA: Diagnosis not present

## 2022-07-19 DIAGNOSIS — M6281 Muscle weakness (generalized): Secondary | ICD-10-CM | POA: Diagnosis not present

## 2022-07-24 DIAGNOSIS — Z96642 Presence of left artificial hip joint: Secondary | ICD-10-CM | POA: Diagnosis not present

## 2022-07-24 DIAGNOSIS — R269 Unspecified abnormalities of gait and mobility: Secondary | ICD-10-CM | POA: Diagnosis not present

## 2022-07-24 DIAGNOSIS — M6281 Muscle weakness (generalized): Secondary | ICD-10-CM | POA: Diagnosis not present

## 2022-08-02 DIAGNOSIS — Z96642 Presence of left artificial hip joint: Secondary | ICD-10-CM | POA: Diagnosis not present

## 2022-08-29 ENCOUNTER — Encounter: Payer: Self-pay | Admitting: Vascular Surgery

## 2022-08-29 ENCOUNTER — Other Ambulatory Visit: Payer: Self-pay | Admitting: Internal Medicine

## 2022-08-29 ENCOUNTER — Ambulatory Visit (INDEPENDENT_AMBULATORY_CARE_PROVIDER_SITE_OTHER): Payer: BC Managed Care – PPO | Admitting: Vascular Surgery

## 2022-08-29 VITALS — BP 158/69 | HR 55 | Temp 97.9°F | Resp 20 | Ht 62.5 in | Wt 175.7 lb

## 2022-08-29 DIAGNOSIS — I6523 Occlusion and stenosis of bilateral carotid arteries: Secondary | ICD-10-CM | POA: Diagnosis not present

## 2022-08-29 DIAGNOSIS — E559 Vitamin D deficiency, unspecified: Secondary | ICD-10-CM

## 2022-08-29 NOTE — Progress Notes (Signed)
[facility] [place]  ASSESSMENT / PLAN: Suzanne Barton is a 71 y.o. 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   The patient should continue best medical therapy for carotid artery stenosis including: Complete cessation from all tobacco products. Blood glucose control with goal A1c < 7%. Blood pressure control with goal blood pressure < 140/90 mmHg. Lipid reduction therapy with goal LDL-C <100 mg/dL Aspirin '81mg'$  PO QD.  Atorvastatin 40-'80mg'$  PO QD (or other "high intensity" statin therapy).  I do not think her brief memory lapses are typical of transient ischemic attacks.  I will refer her to neurology for evaluation of the same, and to ensure they agree that her carotid lesions are not symptomatic.  We will evaluate in 3 months to review the neurology consultation.  CHIEF COMPLAINT: Carotid artery stenosis  HISTORY OF PRESENT ILLNESS: Suzanne Barton is a 71 y.o. female referred to clinic by Dr. Einar Gip for evaluation of bilateral carotid artery stenosis.  The patient is concerned because she will have momentary lapses in memory or word searching difficulty.  She is worried that her carotid artery lesions may be causing these problems.  The bulk of our visit was spent reviewing the natural history of carotid artery stenosis.  I explained to her that carotid artery stenosis can only cause problems I thromboembolism.  Her symptoms are not typical of dominant hemisphere middle cerebral transient ischemic attacks.  She has no other associated focal symptoms such as amaurosis, unilateral weakness or numbness, facial droop, dysarthria.   Past Medical History:  Diagnosis Date   Allergy    Anemia    Arthritis    HIPS,KNEES   Blood transfusion    X 2   Femur fracture, right ([place]) 02/10/2016   GERD (gastroesophageal reflux disease)    Hepatitis C 1992   in remission   Hyperlipidemia    no meds   Hypertension    Neuromuscular disorder (HCC)     hiatal hernia   Pneumonia    Right patella fracture    Seasonal allergies    Thyroid disease    "MILD"    Past Surgical History:  Procedure Laterality Date   APPENDECTOMY     CHOLECYSTECTOMY     COLONOSCOPY  05/23/2022   [facility]   cyst removed     x3- under right arm, x3- groin area- as a teenager   FEMUR IM NAIL Right 02/10/2016   Procedure: INTRAMEDULLARY (IM) NAIL FEMORAL;  Surgeon: Marchia Bond, MD;  Location: [facility];  Service: Orthopedics;  Laterality: Right;   FRACTURE SURGERY  09/12/1971   right femur   FRACTURE SURGERY     left thumb   knee arthoscopy     x3   LIVER BIOPSY     x12   ORIF PATELLA Right 03/07/2019   Procedure: OPEN REDUCTION INTERNAL (ORIF) FIXATION PATELLA;  Surgeon: Marchia Bond, MD;  Location: [facility];  Service: Orthopedics;  Laterality: Right;   OVARIAN CYST SURGERY     PARTIAL HYSTERECTOMY     had partial first, then later had all female organs removed   SPINE SURGERY  11/13/1999   vertebrae fusion   TONSILLECTOMY AND ADENOIDECTOMY     TOTAL HIP ARTHROPLASTY Left 06/20/2022   Procedure: TOTAL HIP ARTHROPLASTY;  Surgeon: Marchia Bond, MD;  Location: [facility];  Service: Orthopedics;  Laterality: Left;   TUBAL LIGATION      Family History  Problem Relation Age of Onset   Lung cancer Mother  Cancer Mother        lung and brain   Heart failure Father 50   COPD Sister    Lung cancer Sister    Fibromyalgia Sister    Colon cancer Neg Hx    Esophageal cancer Neg Hx    Stomach cancer Neg Hx    Rectal cancer Neg Hx    Colon polyps Neg Hx    Crohn's disease Neg Hx     Social History   Socioeconomic History   Marital status: Married    Spouse name: Not on file   Number of children: 1   Years of education: Not on file   Highest education level: Not on file  Occupational History   Occupation: home maker  Tobacco Use   Smoking status: Never    Passive exposure: Past (HUSBAND SMOKED 17 YEARS AGO)   Smokeless tobacco:  Never   Tobacco comments:    husband smoked x 13 yrs  Vaping Use   Vaping Use: Never used  Substance and Sexual Activity   Alcohol use: Yes    Comment: Rare   Drug use: No   Sexual activity: Not on file  Other Topics Concern   Not on file  Social History Narrative   1 ADOPTED CHILD   Social Determinants of Health   Financial Resource Strain: Not on file  Food Insecurity: Not on file  Transportation Needs: Not on file  Physical Activity: Not on file  Stress: Not on file  Social Connections: Not on file  Intimate Partner Violence: Not on file    Allergies  Allergen Reactions   Losartan Potassium-Hctz Shortness Of Breath    Rash   Tape     Redness    Tussionex Pennkinetic Er [Hydrocod Poli-Chlorphe Poli Er] Hives   Amoxicillin Rash    2 GM CEFAZOLIN ADMINISTERED WITHOUT ANY DIFFICULTIES OR REACTIONS 02/10/2016 @ 1900   Ibuprofen Rash    Tolerates ASA   Sulfa Antibiotics Rash    Current Outpatient Medications  Medication Sig Dispense Refill   aspirin EC 325 MG tablet Take 1 tablet (325 mg total) by mouth 2 (two) times daily. 60 tablet 0   atenolol (TENORMIN) 25 MG tablet Take 0.5 tablets (12.5 mg total) by mouth daily. (Patient taking differently: Take 12.5 mg by mouth at bedtime.) 30 tablet 2   Carboxymethylcellulose Sodium (REFRESH TEARS OP) Place 1-2 drops into both eyes daily as needed (dry eyes).     cetirizine (ZYRTEC) 10 MG tablet Take 10 mg by mouth daily.     ezetimibe-simvastatin (VYTORIN) 10-40 MG tablet TAKE ONE TABLET BY MOUTH ONCE DAILY AT 6:00 P.M. (Patient taking differently: Take 1 tablet by mouth at bedtime.) 90 tablet 2   methocarbamol (ROBAXIN) 500 MG tablet Take 1 tablet (500 mg total) by mouth every 8 (eight) hours as needed for muscle spasms. 30 tablet 0   montelukast (SINGULAIR) 10 MG tablet Take 10 mg by mouth daily.     ondansetron (ZOFRAN) 4 MG tablet Take 1 tablet (4 mg total) by mouth every 8 (eight) hours as needed for nausea or vomiting. 10  tablet 0   oxyCODONE (ROXICODONE) 5 MG immediate release tablet Take 1 tablet (5 mg total) by mouth every 4 (four) hours as needed for severe pain. 30 tablet 0   pantoprazole (PROTONIX) 40 MG tablet Take 40 mg by mouth daily.     sennosides-docusate sodium (SENOKOT-S) 8.6-50 MG tablet Take 2 tablets by mouth daily. 30 tablet 1  spironolactone (ALDACTONE) 25 MG tablet Take 1 tablet (25 mg total) by mouth every morning. 30 tablet 2   Vitamin D, Ergocalciferol, (DRISDOL) 1.25 MG (50000 UNIT) CAPS capsule Take 50,000 Units by mouth every Thursday.     No current facility-administered medications for this visit.    PHYSICAL EXAM Vitals:   08/29/22 1118 08/29/22 1120  BP: (!) 124/59 (!) 158/69  Pulse: (!) 55   Resp: 20   Temp: 97.9 F (36.6 C)   SpO2: 100%   Weight: 175 lb 11.2 oz (79.7 kg)   Height: 5' 2.5" (1.588 m)    Well-appearing elderly woman in no acute distress Regular rate and rhythm Unlabored breathing Easily palpable radial pulses  PERTINENT LABORATORY AND RADIOLOGIC DATA  Most recent CBC    Latest Ref Rng & Units 06/21/2022    4:13 AM 06/07/2022    1:21 PM 02/13/2016    5:30 AM  CBC  WBC 4.0 - 10.5 K/uL 9.6  4.8  5.5   Hemoglobin 12.0 - 15.0 g/dL 11.5  14.3  8.6   Hematocrit 36.0 - 46.0 % 34.3  42.5  26.0   Platelets 150 - 400 K/uL 148  190  155      Most recent CMP    Latest Ref Rng & Units 06/21/2022    4:13 AM 06/20/2022   10:10 AM 06/07/2022    1:21 PM  CMP  Glucose 70 - 99 mg/dL 130  81  94   BUN 8 - 23 mg/dL '18  18  15   '$ Creatinine 0.44 - 1.00 mg/dL 1.13  1.25  1.14   Sodium 135 - 145 mmol/L 131  136  139   Potassium 3.5 - 5.1 mmol/L 5.4  3.9  4.3   Chloride 98 - 111 mmol/L 103  104  106   CO2 22 - 32 mmol/L '23  26  28   '$ Calcium 8.9 - 10.3 mg/dL 8.5  9.0  9.7   Total Protein 6.5 - 8.1 g/dL   7.3   Total Bilirubin 0.3 - 1.2 mg/dL   1.0   Alkaline Phos 38 - 126 U/L   79   AST 15 - 41 U/L   22   ALT 0 - 44 U/L   20     Renal function CrCl cannot  be calculated (Patient's most recent lab result is older than the maximum 21 days allowed.).  No results found for: "HGBA1C"  LDL Chol Calc (NIH)  Date Value Ref Range Status  02/02/2021 187 (H) 0 - 99 mg/dL Final   LDL Direct  Date Value Ref Range Status  02/02/2021 182 (H) 0 - 99 mg/dL Final    Outside carotid duplex shows 50 to 69% bilateral carotid artery stenosis.  Yevonne Aline. Stanford Breed, MD FACS [facility] Phone Number: [phone] 08/29/2022 1:21 PM   Total time spent on preparing this encounter including chart review, data review, collecting history, examining the patient, coordinating care for this new patient, 45 minutes.  Portions of this report may have been transcribed using voice recognition software.  Every effort has been made to ensure accuracy; however, inadvertent computerized transcription errors may still be present.

## 2022-08-31 ENCOUNTER — Other Ambulatory Visit (INDEPENDENT_AMBULATORY_CARE_PROVIDER_SITE_OTHER): Payer: BC Managed Care – PPO

## 2022-08-31 ENCOUNTER — Encounter: Payer: Self-pay | Admitting: Physician Assistant

## 2022-08-31 ENCOUNTER — Ambulatory Visit (INDEPENDENT_AMBULATORY_CARE_PROVIDER_SITE_OTHER): Payer: BC Managed Care – PPO | Admitting: Physician Assistant

## 2022-08-31 VITALS — BP 160/72 | HR 91 | Resp 20 | Ht 62.5 in | Wt 176.0 lb

## 2022-08-31 DIAGNOSIS — R413 Other amnesia: Secondary | ICD-10-CM | POA: Diagnosis not present

## 2022-08-31 DIAGNOSIS — R404 Transient alteration of awareness: Secondary | ICD-10-CM | POA: Diagnosis not present

## 2022-08-31 LAB — VITAMIN B12: Vitamin B-12: 237 pg/mL (ref 211–911)

## 2022-08-31 LAB — TSH: TSH: 5.18 u[IU]/mL (ref 0.35–5.50)

## 2022-08-31 NOTE — Patient Instructions (Addendum)
It was a pleasure to see you today at our office.   Recommendations:   MRI of the brain, the radiology office will call you to arrange you appointment Check labs today EEG Follow up in 1 month  Whom to call:  Memory  decline, memory medications: Call our office [phone]   For psychiatric meds, mood meds: Please have your primary care physician manage these medications.     For assessment of decision of mental capacity and competency:  Call Dr. Anthoney Harada, geriatric psychiatrist at [phone]  For guidance in geriatric dementia issues please call [facility] [phone]  For guidance regarding [facility] and if placement were needed at the facility, contact Arnell Asal, Social Worker tel: [phone]  If you have any severe symptoms of a stroke, or other severe issues such as confusion,severe chills or fever, etc call 911 or go to the ER as you may need to be evaluated further      RECOMMENDATIONS FOR ALL PATIENTS WITH MEMORY PROBLEMS: 1. Continue to exercise (Recommend 30 minutes of walking everyday, or 3 hours every week) 2. Increase social interactions - continue going to [place] and enjoy social gatherings with friends and family 3. Eat healthy, avoid fried foods and eat more fruits and vegetables 4. Maintain adequate blood pressure, blood sugar, and blood cholesterol level. Reducing the risk of stroke and cardiovascular disease also helps promoting better memory. 5. Avoid stressful situations. Live a simple life and avoid aggravations. Organize your time and prepare for the next day in anticipation. 6. Sleep well, avoid any interruptions of sleep and avoid any distractions in the bedroom that may interfere with adequate sleep quality 7. Avoid sugar, avoid sweets as there is a strong link between excessive sugar intake, diabetes, and cognitive impairment We discussed the Mediterranean diet, which has been shown to help patients  reduce the risk of progressive memory disorders and reduces cardiovascular risk. This includes eating fish, eat fruits and green leafy vegetables, nuts like almonds and hazelnuts, walnuts, and also use olive oil. Avoid fast foods and fried foods as much as possible. Avoid sweets and sugar as sugar use has been linked to worsening of memory function.  There is always a concern of gradual progression of memory problems. If this is the case, then we may need to adjust level of care according to patient needs. Support, both to the patient and caregiver, should then be put into place.      FALL PRECAUTIONS: Be cautious when walking. Scan the area for obstacles that may increase the risk of trips and falls. When getting up in the mornings, sit up at the edge of the bed for a few minutes before getting out of bed. Consider elevating the bed at the head end to avoid drop of blood pressure when getting up. Walk always in a well-lit room (use night lights in the walls). Avoid area rugs or power cords from appliances in the middle of the walkways. Use a walker or a cane if necessary and consider physical therapy for balance exercise. Get your eyesight checked regularly.  FINANCIAL OVERSIGHT: Supervision, especially oversight when making financial decisions or transactions is also recommended.  HOME SAFETY: Consider the safety of the kitchen when operating appliances like stoves, microwave oven, and blender. Consider having supervision and share cooking responsibilities until no longer able to participate in those. Accidents with firearms and other hazards in the house should be identified and addressed as well.   ABILITY TO BE  LEFT ALONE: If patient is unable to contact 911 operator, consider using LifeLine, or when the need is there, arrange for someone to stay with patients. Smoking is a fire hazard, consider supervision or cessation. Risk of wandering should be assessed by caregiver and if detected at any  point, supervision and safe proof recommendations should be instituted.  MEDICATION SUPERVISION: Inability to self-administer medication needs to be constantly addressed. Implement a mechanism to ensure safe administration of the medications.   DRIVING: Regarding driving, in patients with progressive memory problems, driving will be impaired. We advise to have someone else do the driving if trouble finding directions or if minor accidents are reported. Independent driving assessment is available to determine safety of driving.   If you are interested in the driving assessment, you can contact the following:  [employer] in [place]  K[place] [phone] or [phone]    [place] refers to food and lifestyle choices that are based on the traditions of countries located on the [employer]. This way of eating has been shown to help prevent certain conditions and improve outcomes for people who have chronic diseases, like kidney disease and heart disease. What are tips for following this plan? Lifestyle  Cook and eat meals together with your family, when possible. Drink enough fluid to keep your urine clear or pale yellow. Be physically active every day. This includes: Aerobic exercise like running or swimming. Leisure activities like gardening, walking, or housework. Get 7-8 hours of sleep each night. If recommended by your health care provider, drink red wine in moderation. This means 1 glass a day for nonpregnant women and 2 glasses a day for men. A glass of wine equals 5 oz (150 mL). Reading food labels  Check the serving size of packaged foods. For foods such as rice and pasta, the serving size refers to the amount of cooked product, not dry. Check the total fat in packaged foods. Avoid foods that have saturated fat or trans  fats. Check the ingredients list for added sugars, such as corn syrup. Shopping  At the grocery store, buy most of your food from the areas near the walls of the store. This includes: Fresh fruits and vegetables (produce). Grains, beans, nuts, and seeds. Some of these may be available in unpackaged forms or large amounts (in bulk). Fresh seafood. Poultry and eggs. Low-fat dairy products. Buy whole ingredients instead of prepackaged foods. Buy fresh fruits and vegetables in-season from local farmers markets. Buy frozen fruits and vegetables in resealable bags. If you do not have access to quality fresh seafood, buy precooked frozen shrimp or canned fish, such as tuna, salmon, or sardines. Buy small amounts of raw or cooked vegetables, salads, or olives from the deli or salad bar at your store. Stock your pantry so you always have certain foods on hand, such as olive oil, canned tuna, canned tomatoes, rice, pasta, and beans. Cooking  Cook foods with extra-virgin olive oil instead of using butter or other vegetable oils. Have meat as a side dish, and have vegetables or grains as your main dish. This means having meat in small portions or adding small amounts of meat to foods like pasta or stew. Use beans or vegetables instead of meat in common dishes like chili or lasagna. Experiment with different cooking methods. Try roasting or broiling vegetables instead of steaming or sauteing them. Add frozen vegetables to soups, stews,  pasta, or rice. Add nuts or seeds for added healthy fat at each meal. You can add these to yogurt, salads, or vegetable dishes. Marinate fish or vegetables using olive oil, lemon juice, garlic, and fresh herbs. Meal planning  Plan to eat 1 vegetarian meal one day each week. Try to work up to 2 vegetarian meals, if possible. Eat seafood 2 or more times a week. Have healthy snacks readily available, such as: Vegetable sticks with hummus. Greek yogurt. Fruit and nut  trail mix. Eat balanced meals throughout the week. This includes: Fruit: 2-3 servings a day Vegetables: 4-5 servings a day Low-fat dairy: 2 servings a day Fish, poultry, or lean meat: 1 serving a day Beans and legumes: 2 or more servings a week Nuts and seeds: 1-2 servings a day Whole grains: 6-8 servings a day Extra-virgin olive oil: 3-4 servings a day Limit red meat and sweets to only a few servings a month What are my food choices? Mediterranean diet Recommended Grains: Whole-grain pasta. Brown rice. Bulgar wheat. Polenta. Couscous. Whole-wheat bread. [place]. Vegetables: Artichokes. Beets. Broccoli. Cabbage. Carrots. Eggplant. Green beans. Chard. Kale. Spinach. Onions. Leeks. Peas. Squash. Tomatoes. Peppers. Radishes. Fruits: Apples. Apricots. Avocado. Berries. Bananas. Cherries. Dates. Figs. Grapes. Lemons. Melon. Oranges. Peaches. Plums. Pomegranate. Meats and other protein foods: Beans. Almonds. Sunflower seeds. Pine nuts. Peanuts. [place]. Salmon. Scallops. Shrimp. [place]. Tilapia. Clams. Oysters. Eggs. Dairy: Low-fat milk. Cheese. Greek yogurt. Beverages: Water. Red wine. Herbal tea. Fats and oils: Extra virgin olive oil. Avocado oil. Grape seed oil. Sweets and desserts: [place] yogurt with honey. Baked apples. Poached pears. Trail mix. Seasoning and other foods: Basil. Cilantro. Coriander. Cumin. Mint. Parsley. Sage. Rosemary. Tarragon. Garlic. Oregano. Thyme. Pepper. Balsalmic vinegar. Tahini. Hummus. Tomato sauce. Olives. Mushrooms. Limit these Grains: Prepackaged pasta or rice dishes. Prepackaged cereal with added sugar. Vegetables: Deep fried potatoes (french fries). Fruits: Fruit canned in syrup. Meats and other protein foods: Beef. Pork. Lamb. Poultry with skin. Hot dogs. Berniece Salines. Dairy: Ice cream. Sour cream. Whole milk. Beverages: Juice. Sugar-sweetened soft drinks. Beer. Liquor and spirits. Fats and oils: Butter. Canola oil. Vegetable oil. Beef fat (tallow).  Lard. Sweets and desserts: Cookies. Cakes. Pies. Candy. Seasoning and other foods: Mayonnaise. Premade sauces and marinades. The items listed may not be a complete list. Talk with your dietitian about what dietary choices are right for you. Summary The Mediterranean diet includes both food and lifestyle choices. Eat a variety of fresh fruits and vegetables, beans, nuts, seeds, and whole grains. Limit the amount of red meat and sweets that you eat. Talk with your health care provider about whether it is safe for you to drink red wine in moderation. This means 1 glass a day for nonpregnant women and 2 glasses a day for men. A glass of wine equals 5 oz (150 mL). This information is not intended to replace advice given to you by your health care provider. Make sure you discuss any questions you have with your health care provider. Document Released: 04/20/2016 Document Revised: 05/23/2016 Document Reviewed: 04/20/2016 Elsevier Interactive Patient Education  2017 [place] will be at [place], [phone]
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
## 2022-09-01 ENCOUNTER — Telehealth: Payer: Self-pay

## 2022-09-01 DIAGNOSIS — Z96642 Presence of left artificial hip joint: Secondary | ICD-10-CM | POA: Diagnosis not present

## 2022-09-01 NOTE — Progress Notes (Signed)
B12 is on the lower side, need to take 1 tab 1000 mcg daily and follow with Primary. It is 237, we like it between [phone] thanks . The thyroid levels are normal

## 2022-09-01 NOTE — Telephone Encounter (Signed)
Pt called an informed that B12 is on the lower side, need to take 1 tab 1000 mcg daily and follow with Primary. It is 237, we like it between [phone] thanks .  The thyroid levels are normal

## 2022-09-01 NOTE — Telephone Encounter (Signed)
-----   Message from Rondel Jumbo, PA-C sent at 09/01/2022  7:29 AM EST ----- B12 is on the lower side, need to take 1 tab 1000 mcg daily and follow with Primary. It is 237, we like it between [phone] thanks . The thyroid levels are normal

## 2022-09-05 ENCOUNTER — Other Ambulatory Visit: Payer: Self-pay

## 2022-09-05 ENCOUNTER — Other Ambulatory Visit: Payer: BC Managed Care – PPO

## 2022-09-05 DIAGNOSIS — I6523 Occlusion and stenosis of bilateral carotid arteries: Secondary | ICD-10-CM

## 2022-09-07 ENCOUNTER — Ambulatory Visit (INDEPENDENT_AMBULATORY_CARE_PROVIDER_SITE_OTHER): Payer: BC Managed Care – PPO | Admitting: Neurology

## 2022-09-07 DIAGNOSIS — R404 Transient alteration of awareness: Secondary | ICD-10-CM

## 2022-09-07 NOTE — Progress Notes (Signed)
EEG complete - results pending 

## 2022-09-09 ENCOUNTER — Other Ambulatory Visit: Payer: Self-pay | Admitting: Cardiology

## 2022-09-09 DIAGNOSIS — E7800 Pure hypercholesterolemia, unspecified: Secondary | ICD-10-CM

## 2022-09-12 ENCOUNTER — Telehealth: Payer: Self-pay | Admitting: Physician Assistant

## 2022-09-12 NOTE — Procedures (Signed)
ELECTROENCEPHALOGRAM REPORT  Date of Study: 09/07/2022  Patient's Name: Suzanne Barton MRN: [id number] Date of Birth: [birth year]  Referring Provider: Sharene Butters, PA-C  Clinical History: This is a 72 year old woman with brief moments of memory lapses, some while driving. EEG for classification.  Medications: Aspirin, Atenolol, Vytorin,Robaxin, Oxycodone, Aldactone  Technical Summary: A multichannel digital 1-hour EEG recording measured by the international 10-20 system with electrodes applied with paste and impedances below 5000 ohms performed in our laboratory with EKG monitoring in an awake and asleep patient.  Hyperventilation was not performed. Photic stimulation was performed.  The digital EEG was referentially recorded, reformatted, and digitally filtered in a variety of bipolar and referential montages for optimal display.    Description: The patient is awake and asleep during the recording.  During maximal wakefulness, there is a symmetric, medium voltage 8-9 Hz posterior dominant rhythm that attenuates with eye opening.  There is frequent focal 4 Hz theta slowing over the left temporal region, at times sharply contoured. During drowsiness and sleep, there is an increase in theta slowing of the background.  Vertex waves and symmetric sleep spindles were seen. Photic stimulation did not elicit any abnormalities, however there was a run of left temporal slowing after IPS at '11Hz'$  and 19 Hz frequencies without evolution in frequency or amplitude.  There were no clear epileptiform discharges or electrographic seizures seen.    EKG lead was unremarkable.  Impression: This 1-hour awake and asleep EEG is abnormal due to frequent focal slowing over the left temporal region.  Clinical Correlation of the above findings indicates focal cerebral dysfunction over the left temporal region suggestive of underlying structural or physiologic abnormality. The absence of epileptiform discharges  does not exclude a clinical diagnosis of epilepsy. Clinical correlation is advised.    Ellouise Newer, M.D.

## 2022-09-12 NOTE — Telephone Encounter (Signed)
Spoke to patient 12:36 pm  regarding EEG results. EEG showed changes over the left side of the brain,  nonspecific . Will await for MRI results to correlate  the EEG findings. However, due to the momentary lapses, we explained that would do a 48-hour EEG to capture these episodes and brain waves to determine the frequency . Patient appreciated the call and information, agrees to proceed.

## 2022-09-18 ENCOUNTER — Other Ambulatory Visit: Payer: BC Managed Care – PPO

## 2022-09-28 ENCOUNTER — Ambulatory Visit: Payer: BC Managed Care – PPO | Admitting: Cardiology

## 2022-10-04 ENCOUNTER — Ambulatory Visit (INDEPENDENT_AMBULATORY_CARE_PROVIDER_SITE_OTHER): Payer: BC Managed Care – PPO | Admitting: Neurology

## 2022-10-04 ENCOUNTER — Ambulatory Visit: Payer: BC Managed Care – PPO | Admitting: Physician Assistant

## 2022-10-04 DIAGNOSIS — R9401 Abnormal electroencephalogram [EEG]: Secondary | ICD-10-CM | POA: Diagnosis not present

## 2022-10-04 DIAGNOSIS — R404 Transient alteration of awareness: Secondary | ICD-10-CM
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
## 2022-10-04 NOTE — Progress Notes (Signed)
Ambulatory EEG hooked up and running. Light flashing. Push button tested. Camera and event log explained. Batteries explained. Patient understood.   

## 2022-10-06 NOTE — Progress Notes (Signed)
Ambulatory EEG discontinued/removed. No skin breakdown at [facility]. Diary returned.

## 2022-10-07 ENCOUNTER — Ambulatory Visit
Admission: RE | Admit: 2022-10-07 | Discharge: 2022-10-07 | Disposition: A | Discharge status: Home / Self Care | Payer: BC Managed Care – PPO | Source: Ambulatory Visit | Attending: Physician Assistant | Admitting: Physician Assistant

## 2022-10-07 DIAGNOSIS — R262 Difficulty in walking, not elsewhere classified: Secondary | ICD-10-CM | POA: Diagnosis not present

## 2022-10-07 DIAGNOSIS — R410 Disorientation, unspecified: Secondary | ICD-10-CM | POA: Diagnosis not present

## 2022-10-10 NOTE — Progress Notes (Signed)
Brain MRI is normal for age. Thanks

## 2022-10-12 ENCOUNTER — Telehealth: Payer: Self-pay | Admitting: Anesthesiology

## 2022-10-12 ENCOUNTER — Ambulatory Visit: Payer: BC Managed Care – PPO | Admitting: Physician Assistant

## 2022-10-12 ENCOUNTER — Encounter: Payer: Self-pay | Admitting: Physician Assistant

## 2022-10-12 VITALS — BP 138/85 | HR 74 | Resp 18 | Ht 62.5 in | Wt 173.0 lb

## 2022-10-12 DIAGNOSIS — R404 Transient alteration of awareness: Secondary | ICD-10-CM

## 2022-10-12 NOTE — Patient Instructions (Signed)
Brain MRI is normal. We are awaiting for the 48 h EEG and if positive will make an app with Dr. Delice Lesch for follow up and monitor driving till then , if seizure, need to hold driving for 6 month

## 2022-10-12 NOTE — Telephone Encounter (Signed)
Wanted you to know, she is only sleeping 4 hours per night fyi.

## 2022-10-12 NOTE — Progress Notes (Signed)
Assessment/Plan:   Transient Alteration of Awareness   Suzanne Barton is a very pleasant 72 y.o. RH female with  a history of hypertension, hyperlipidemia, pre-diabetes osteoarthritis, with recent total left hip arthroplasty October 2023, hypothyroidism, history of hepatitis C in remission (1992), GERD, anemia, carotid artery stenosis 50-69 %  presenting today in follow-up to discuss the MRI of the brain after presenting with memory difficulties and transient alteration of awareness.  Initial EEG 09/07/2022 (1 hour) showed some abnormalities, due to frequent focal slowing over the left temporal region.  48-hour EEG results are still pending.  Personally reviewed of the brain from 10/07/2022 is without acute intracranial abnormalities, normal for age appearance of the brain    Recommendations:   Follow up pending on the EEG results.  If epilepsy is noted, will proceed with antiseizure medications. Continue good control of cardiovascular risk factors, continue aspirin daily. Recommend  no driving for 6 months according to the [place] driving laws until cleared from 48 h EEG  Follow up pending on the EEG results Continue to control mood as per PCP    Subjective:   This patient is here alone  Previous records as well as any outside records available were reviewed prior to todays visit.        Any changes in memory since last visit? "Slowly getting there, occasionally I have a moment of Glitching, I stop on a green light and I don't know why " I drive only on emptier roads and short distances daytime.   PREVIOUS MEDICATIONS:   CURRENT MEDICATIONS:  Outpatient Encounter Medications as of 10/12/2022  Medication Sig   aspirin EC 325 MG tablet Take 1 tablet (325 mg total) by mouth 2 (two) times daily.   atenolol (TENORMIN) 25 MG tablet Take 0.5 tablets (12.5 mg total) by mouth daily. (Patient taking differently: Take 12.5 mg by mouth at bedtime.)   Carboxymethylcellulose Sodium (REFRESH  TEARS OP) Place 1-2 drops into both eyes daily as needed (dry eyes).   cetirizine (ZYRTEC) 10 MG tablet Take 10 mg by mouth daily.   ezetimibe-simvastatin (VYTORIN) 10-40 MG tablet TAKE ONE TABLET BY MOUTH ONCE DAILY AT 6:00 P.M.   methocarbamol (ROBAXIN) 500 MG tablet Take 1 tablet (500 mg total) by mouth every 8 (eight) hours as needed for muscle spasms.   montelukast (SINGULAIR) 10 MG tablet Take 10 mg by mouth daily.   ondansetron (ZOFRAN) 4 MG tablet Take 1 tablet (4 mg total) by mouth every 8 (eight) hours as needed for nausea or vomiting.   oxyCODONE (ROXICODONE) 5 MG immediate release tablet Take 1 tablet (5 mg total) by mouth every 4 (four) hours as needed for severe pain. (Patient not taking: Reported on 08/31/2022)   pantoprazole (PROTONIX) 40 MG tablet Take 40 mg by mouth daily.   sennosides-docusate sodium (SENOKOT-S) 8.6-50 MG tablet Take 2 tablets by mouth daily.   spironolactone (ALDACTONE) 25 MG tablet Take 1 tablet (25 mg total) by mouth every morning.   Vitamin D, Ergocalciferol, (DRISDOL) 1.25 MG (50000 UNIT) CAPS capsule Take 50,000 Units by mouth every Thursday.   No facility-administered encounter medications on file as of 10/12/2022.        08/31/2022    8:00 AM  Montreal Cognitive Assessment   Visuospatial/ Executive (0/5) 2  Naming (0/3) 3  Attention: Read list of digits (0/2) 1  Attention: Read list of letters (0/1) 1  Attention: Serial 7 subtraction starting at 100 (0/3) 3  Language: Repeat phrase (0/2) 1  Language : Fluency (0/1) 0  Abstraction (0/2) 2  Delayed Recall (0/5) 4  Orientation (0/6) 6  Total 23  Adjusted Score (based on education) 23        No data to display          Thank you for allowing [place] the opportunity to participate in the care of this nice patient. Please do not hesitate to contact us for any questions or concerns.   Total time spent on today's visit was 38 minutes dedicated to this patient today, preparing to see patient,  examining the patient, ordering tests and/or medications and counseling the patient, documenting clinical information in the EHR or other health record, independently interpreting results and communicating results to the patient/family, discussing treatment and goals, answering patient's questions and coordinating care.  Cc:  Debbora Lacrosse, FNP  Sharene Butters 10/12/2022 7:24 AM

## 2022-10-12 NOTE — Telephone Encounter (Signed)
Pt left message with AN requesting a call back. States has questions regarding today's office visit.

## 2022-10-23 ENCOUNTER — Other Ambulatory Visit: Payer: Self-pay | Admitting: Cardiology

## 2022-10-23 DIAGNOSIS — I10 Essential (primary) hypertension: Secondary | ICD-10-CM

## 2022-11-07 ENCOUNTER — Ambulatory Visit: Payer: BC Managed Care – PPO | Admitting: Neurology

## 2022-11-07 ENCOUNTER — Encounter: Payer: Self-pay | Admitting: Neurology

## 2022-11-07 VITALS — BP 138/84 | HR 66 | Ht 62.5 in | Wt 175.5 lb

## 2022-11-07 DIAGNOSIS — R9401 Abnormal electroencephalogram [EEG]: Secondary | ICD-10-CM

## 2022-11-07 DIAGNOSIS — R413 Other amnesia: Secondary | ICD-10-CM

## 2022-11-07 NOTE — Addendum Note (Signed)
Addended by: Cameron Sprang on: 11/07/2022 12:36 PM   Modules accepted: Orders

## 2022-11-07 NOTE — Patient Instructions (Signed)
Good to meet you. Please monitor for any change in symptoms. Follow-up with Sharene Butters in 6 months, call for any changes.

## 2022-11-07 NOTE — Progress Notes (Signed)
NEUROLOGY FOLLOW UP OFFICE NOTE  MARTE BECKNELL [id number] 1951-02-15  HISTORY OF PRESENT ILLNESS: I had the pleasure of seeing Ronicia Wuethrich in follow-up in the neurology clinic on 11/07/2022.  The patient was last seen by Memory Disorders PA Sharene Butters 3 weeks ago for memory loss. On her initial visit in 08/2022, she reported momentary memory lapses, difficulty with word search, and stopping at green lights for no reason. 1-hour EEG in 08/2022 showed frequent focal slowing over the left temporal region. I personally reviewed MRI brain without contrast done 09/2022 which did not show any acute changes, hippocampi symmetric with no abnormal signal seen. She had a 47-hour ambulatory EEG which showed frequent runs of rhythmic left temporal activity without evolution in frequency or amplitude, but some with spread diffusely, concerning for EEG pattern seen in the ictal-interictal continuum.  Patient presents today reporting that she feels all her symptoms are related to her inability to sleep well at night. In between her cat, puppy, and frequent urination, she gets 4-5 hours of sleep. Sometimes she may nap for several hours during the day. She lives with her husband and son. She denies any staring/unresponsive episodes. She had reported stopping at a green light to Ms. Wertman, but today states that it is due to her fear of accidents, when she sees a car on her side, she would stop, "like a glitch, brain tells me to stop," but she denies losing awareness/consciousness, no gaps in time. Lately she would have to stop and think of what she wants to say, because she is not sure if she is using the right frame of word. It just "does not pup out." She does not think it has anything to do with her brain. She states memory is not a concern, it is just trying to remember words because sometimes they just don't come to her right away. She denies any olfactory/gustatory hallucinations, rising epigastric  sensation, focal numbness/tingling/weakness, myoclonic jerks. She had a normal birth and early development. No history of febrile convulsions, CNS infections, significant head injuries, neurosurgical procedures, or family history of seizures.    PAST MEDICAL HISTORY: Past Medical History:  Diagnosis Date   Allergy    Anemia    Arthritis    HIPS,KNEES   Blood transfusion    X 2   Femur fracture, right ([place]) 02/10/2016   GERD (gastroesophageal reflux disease)    Hepatitis C 1992   in remission   Hyperlipidemia    no meds   Hypertension    Neuromuscular disorder (HCC)    hiatal hernia   Pneumonia    Right patella fracture    Seasonal allergies    Thyroid disease    "MILD"    MEDICATIONS: Current Outpatient Medications on File Prior to Visit  Medication Sig Dispense Refill   aspirin EC 325 MG tablet Take 1 tablet (325 mg total) by mouth 2 (two) times daily. 60 tablet 0   atenolol (TENORMIN) 25 MG tablet Take 0.5 tablets (12.5 mg total) by mouth daily. (Patient taking differently: Take 12.5 mg by mouth at bedtime.) 30 tablet 2   Carboxymethylcellulose Sodium (REFRESH TEARS OP) Place 1-2 drops into both eyes daily as needed (dry eyes).     cetirizine (ZYRTEC) 10 MG tablet Take 10 mg by mouth daily.     ezetimibe-simvastatin (VYTORIN) 10-40 MG tablet TAKE ONE TABLET BY MOUTH ONCE DAILY AT 6:00 P.M. 90 tablet 0   methocarbamol (ROBAXIN) 500 MG tablet Take 1 tablet (500 mg  total) by mouth every 8 (eight) hours as needed for muscle spasms. 30 tablet 0   montelukast (SINGULAIR) 10 MG tablet Take 10 mg by mouth daily.     ondansetron (ZOFRAN) 4 MG tablet Take 1 tablet (4 mg total) by mouth every 8 (eight) hours as needed for nausea or vomiting. 10 tablet 0   oxyCODONE (ROXICODONE) 5 MG immediate release tablet Take 1 tablet (5 mg total) by mouth every 4 (four) hours as needed for severe pain. 30 tablet 0   pantoprazole (PROTONIX) 40 MG tablet Take 40 mg by mouth daily.      sennosides-docusate sodium (SENOKOT-S) 8.6-50 MG tablet Take 2 tablets by mouth daily. 30 tablet 1   spironolactone (ALDACTONE) 25 MG tablet TAKE ONE TABLET BY MOUTH EVERY MORNING 30 tablet 2   Vitamin D, Ergocalciferol, (DRISDOL) 1.25 MG (50000 UNIT) CAPS capsule Take 50,000 Units by mouth every Thursday.     No current facility-administered medications on file prior to visit.    ALLERGIES: Allergies  Allergen Reactions   Losartan Potassium-Hctz Shortness Of Breath    Rash   Tape     Redness    Tussionex Pennkinetic Er [Hydrocod Poli-Chlorphe Poli Er] Hives   Amoxicillin Rash    2 GM CEFAZOLIN ADMINISTERED WITHOUT ANY DIFFICULTIES OR REACTIONS 02/10/2016 @ 1900   Ibuprofen Rash    Tolerates ASA   Sulfa Antibiotics Rash    FAMILY HISTORY: Family History  Problem Relation Age of Onset   Lung cancer Mother    Cancer Mother        lung and brain   Heart failure Father 10   COPD Sister    Lung cancer Sister    Fibromyalgia Sister    Colon cancer Neg Hx    Esophageal cancer Neg Hx    Stomach cancer Neg Hx    Rectal cancer Neg Hx    Colon polyps Neg Hx    Crohn's disease Neg Hx     SOCIAL HISTORY: Social History   Socioeconomic History   Marital status: Married    Spouse name: Not on file   Number of children: 1   Years of education: 13   Highest education level: Not on file  Occupational History   Occupation: home maker  Tobacco Use   Smoking status: Never    Passive exposure: Past (HUSBAND SMOKED 17 YEARS AGO)   Smokeless tobacco: Never   Tobacco comments:    husband smoked x 13 yrs  Vaping Use   Vaping Use: Never used  Substance and Sexual Activity   Alcohol use: Yes    Comment: Rare   Drug use: No   Sexual activity: Not on file  Other Topics Concern   Not on file  Social History Narrative   1 ADOPTED CHILD   Right handed   Lives husband   One story home   retired   Science writer Determinants of Radio broadcast assistant Strain: Not on file  Food  Insecurity: Not on file  Transportation Needs: Not on file  Physical Activity: Not on file  Stress: Not on file  Social Connections: Not on file  Intimate Partner Violence: Not on file     PHYSICAL EXAM: Vitals:   11/07/22 1111  BP: 138/84  Pulse: 66  SpO2: 100%   General: No acute distress Head:  Normocephalic/atraumatic Skin/Extremities: No rash, no edema Neurological Exam: alert and awake. No aphasia or dysarthria. Fund of knowledge is appropriate.  Attention and concentration are normal.  Cranial nerves: Pupils equal, round. Extraocular movements intact with no nystagmus. Visual fields full.  No facial asymmetry.  Motor: Bulk and tone normal, muscle strength 5/5 throughout with no pronator drift.   Finger to nose testing intact.  Gait slow and cautious due to right hip issues. No ataxia.    IMPRESSION: This is a pleasant 72 yo RH woman seen initially in our office last 08/2022 for memory concerns. MoCA 21/30. She had reported momentary memory lapses and had an abnormal routine EEG. She presents to discuss results of the ambulatory 47-hour EEG which was abnormal, concerning due to rhythmic pattern localized on the left temporal region, which is on the ictal-interictal continuum with high potential for seizures. Discussed recommendation to start seizure medication and monitor if cognitive/word-finding changes improve, then repeating an EEG on medication. She reports historically being very sensitive to medications and declines starting medication at this time, understanding risks of possible seizure. She adamantly denies any episodes of unresponsiveness or gaps in time/loss of awareness. We agreed to closely monitor symptoms clinically and hold off on starting medication, she knows to call our office for any change in symptoms. We discussed avoidance of seizure triggers, including improving sleep hygiene. She is aware of [place] driving laws to stop driving after an episode of loss of  awareness/consciousness until 6 months event-free. Follow-up with Memory Disorders PA Sharene Butters in 6 months.   Thank you for allowing me to participate in her care.  Please do not hesitate to call for any questions or concerns.    Ellouise Newer, M.D.   CC: Sharene Butters, PA-C, Campbell Stall, FNP

## 2022-11-07 NOTE — Procedures (Signed)
ELECTROENCEPHALOGRAM REPORT  Dates of Recording: 10/04/2022 10:22AM to 10/06/2022 9:31AM  Patient's Name: Suzanne Barton MRN: [id number] Date of Birth: 07-Jul-1951  Referring Provider: Sharene Butters, PA-C  Procedure: 47-hour ambulatory video EEG  History: This is a 72 year old woman reporting memory and word-finding difficulties, stopping at a green light. Routine EEG abnormal. Ambulatory EEG ordered for further characterization.   Medications: aspirin, Atenolol, Vytorin, Aldactone  Technical Summary: This is a 47-hour multichannel digital video EEG recording measured by the international 10-20 system with electrodes applied with paste and impedances below 5000 ohms performed as portable with EKG monitoring.  The digital EEG was referentially recorded, reformatted, and digitally filtered in a variety of bipolar and referential montages for optimal display.    DESCRIPTION OF RECORDING: During maximal wakefulness, there is no clear posterior dominant rhythm seen. There are frequent runs of rhythm 5-6 Hz theta activity seen on the left temporal region, at times sharply contoured, lasting up to 50 seconds without evolution in frequency or amplitude, but some with spread diffusely. There were no clinical symptoms reported or seen on video (patient tapping on her phone during one where there is diffuse spread). There were no epileptiform discharges seen in wakefulness.  During the recording, the patient progresses through wakefulness, drowsiness, and Stage 2 sleep.  There is occasional focal 4 Hz sharply contoured activity seen over the left temporal region. Again, there were no clear epileptiform discharges seen.  Events: On 1/24 at 2145 hours, she had a headache. Patient sitting, no clinical changes seen. Electrographically, there were no EEG or EKG changes seen.  On 1/25 at 1602 hours, she has double vision. Patient not on video. Electrographically, there were no EEG or EKG changes  seen.   EKG lead was unremarkable.  IMPRESSION: This 47-hour ambulatory video EEG study is abnormal due to the presence of frequent runs of rhythmic 4-5 Hz theta activity over the left temporal region.  CLINICAL CORRELATION: Rhythmic pattern localized on the left temporal region is on the ictal-interictal continuum with high potential for seizures. If further clinical questions remain, inpatient video EEG monitoring may be helpful.   Ellouise Newer, M.D.

## 2022-11-25 LAB — LAB REPORT - SCANNED
A1c: 5.7
EGFR: 50

## 2022-11-27 NOTE — Progress Notes (Signed)
[facility] [place]  ASSESSMENT / PLAN: Suzanne Barton is a 72 y.o. female with bilateral carotid artery stenosis.   The patient should continue best medical therapy for carotid artery stenosis including: Complete cessation from all tobacco products. Blood glucose control with goal A1c < 7%. Blood pressure control with goal blood pressure < 140/90 mmHg. Lipid reduction therapy with goal LDL-C <100 mg/dL Aspirin 81mg  PO QD.  Atorvastatin 40-80mg  PO QD (or other "high intensity" statin therapy).  Will continue surveillance of carotid disease. Follow up with me in 1 year with carotid duplex.   CHIEF COMPLAINT: Carotid artery stenosis  HISTORY OF PRESENT ILLNESS: Suzanne Barton is a 72 y.o. female referred to clinic by Dr. Jacinto Halim for evaluation of bilateral carotid artery stenosis.  The patient is concerned because she will have momentary lapses in memory or word searching difficulty.  She is worried that her carotid artery lesions may be causing these problems.  The bulk of our visit was spent reviewing the natural history of carotid artery stenosis.  I explained to her that carotid artery stenosis can only cause problems I thromboembolism.  Her symptoms are not typical of dominant hemisphere middle cerebral transient ischemic attacks.  She has no other associated focal symptoms such as amaurosis, unilateral weakness or numbness, facial droop, dysarthria.  11/28/22: patient returns to review neurology evaluation. Possible seizure disorder. Patient leery about starting antiseizure medicine because of issues with fatigue. She is following with them closely. No new focal neurologic symptoms.   Past Medical History:  Diagnosis Date   Allergy    Anemia    Arthritis    HIPS,KNEES   Blood transfusion    X 2   Femur fracture, right (HCC) 02/10/2016   GERD (gastroesophageal reflux disease)    Hepatitis C 1992   in remission   Hyperlipidemia    no meds    Hypertension    Neuromuscular disorder (HCC)    hiatal hernia   Pneumonia    Right patella fracture    Seasonal allergies    Thyroid disease    "MILD"    Past Surgical History:  Procedure Laterality Date   APPENDECTOMY     CHOLECYSTECTOMY     COLONOSCOPY  05/23/2022   [facility]   cyst removed     x3- under right arm, x3- groin area- as a teenager   FEMUR IM NAIL Right 02/10/2016   Procedure: INTRAMEDULLARY (IM) NAIL FEMORAL;  Surgeon: Teryl Lucy, MD;  Location: [facility];  Service: Orthopedics;  Laterality: Right;   FRACTURE SURGERY  09/12/1971   right femur   FRACTURE SURGERY     left thumb   knee arthoscopy     x3   LIVER BIOPSY     x12   ORIF PATELLA Right 03/07/2019   Procedure: OPEN REDUCTION INTERNAL (ORIF) FIXATION PATELLA;  Surgeon: Teryl Lucy, MD;  [facility];  Service: Orthopedics;  Laterality: Right;   OVARIAN CYST SURGERY     PARTIAL HYSTERECTOMY     had partial first, then later had all female organs removed   SPINE SURGERY  11/13/1999   vertebrae fusion   TONSILLECTOMY AND ADENOIDECTOMY     TOTAL HIP ARTHROPLASTY Left 06/20/2022   Procedure: TOTAL HIP ARTHROPLASTY;  Surgeon: Teryl Lucy, MD;  Location: [facility];  Service: Orthopedics;  Laterality: Left;   TUBAL LIGATION      Family History  Problem Relation Age of Onset   Lung cancer Mother  Cancer Mother        lung and brain   Heart failure Father 47   COPD Sister    Lung cancer Sister    Fibromyalgia Sister    Colon cancer Neg Hx    Esophageal cancer Neg Hx    Stomach cancer Neg Hx    Rectal cancer Neg Hx    Colon polyps Neg Hx    Crohn's disease Neg Hx     Social History   Socioeconomic History   Marital status: Married    Spouse name: Not on file   Number of children: 1   Years of education: 13   Highest education level: Not on file  Occupational History   Occupation: home maker  Tobacco Use   Smoking status: Never    Passive exposure: Past (HUSBAND  SMOKED 17 YEARS AGO)   Smokeless tobacco: Never   Tobacco comments:    husband smoked x 13 yrs  Vaping Use   Vaping Use: Never used  Substance and Sexual Activity   Alcohol use: Yes    Comment: Rare   Drug use: No   Sexual activity: Not on file  Other Topics Concern   Not on file  Social History Narrative   1 ADOPTED CHILD   Right handed   Lives husband   One story home   retired   Chief Executive Officer Determinants of Corporate investment banker Strain: Not on file  Food Insecurity: Not on file  Transportation Needs: Not on file  Physical Activity: Not on file  Stress: Not on file  Social Connections: Not on file  Intimate Partner Violence: Not on file    Allergies  Allergen Reactions   Losartan Potassium-Hctz Shortness Of Breath    Rash   Tape     Redness    Tussionex Pennkinetic Er [Hydrocod Poli-Chlorphe Poli Er] Hives   Amoxicillin Rash    2 GM CEFAZOLIN ADMINISTERED WITHOUT ANY DIFFICULTIES OR REACTIONS 02/10/2016 @ 1900   Ibuprofen Rash    Tolerates ASA   Sulfa Antibiotics Rash    Current Outpatient Medications  Medication Sig Dispense Refill   aspirin EC 81 MG tablet Take 81 mg by mouth daily. Swallow whole.     atenolol (TENORMIN) 25 MG tablet Take 0.5 tablets (12.5 mg total) by mouth daily. (Patient taking differently: Take 12.5 mg by mouth at bedtime.) 30 tablet 2   Carboxymethylcellulose Sodium (REFRESH TEARS OP) Place 1-2 drops into both eyes daily as needed (dry eyes).     cetirizine (ZYRTEC) 10 MG tablet Take 10 mg by mouth daily.     cyanocobalamin (VITAMIN B12) 1000 MCG tablet Take 1,000 mcg by mouth daily.     ezetimibe-simvastatin (VYTORIN) 10-40 MG tablet TAKE ONE TABLET BY MOUTH ONCE DAILY AT 6:00 P.M. 90 tablet 0   methocarbamol (ROBAXIN) 500 MG tablet Take 1 tablet (500 mg total) by mouth every 8 (eight) hours as needed for muscle spasms. 30 tablet 0   montelukast (SINGULAIR) 10 MG tablet Take 10 mg by mouth daily.     ondansetron (ZOFRAN) 4 MG tablet Take  1 tablet (4 mg total) by mouth every 8 (eight) hours as needed for nausea or vomiting. 10 tablet 0   pantoprazole (PROTONIX) 40 MG tablet Take 40 mg by mouth daily.     sennosides-docusate sodium (SENOKOT-S) 8.6-50 MG tablet Take 2 tablets by mouth daily. 30 tablet 1   spironolactone (ALDACTONE) 25 MG tablet TAKE ONE TABLET BY MOUTH EVERY MORNING 30 tablet  2   Vitamin D, Ergocalciferol, (DRISDOL) 1.25 MG (50000 UNIT) CAPS capsule Take 50,000 Units by mouth every Thursday.     No current facility-administered medications for this visit.    PHYSICAL EXAM There were no vitals filed for this visit.  Well-appearing elderly woman in no acute distress Regular rate and rhythm Unlabored breathing Easily palpable radial pulses  PERTINENT LABORATORY AND RADIOLOGIC DATA  Most recent CBC    Latest Ref Rng & Units 06/21/2022    4:13 AM 06/07/2022    1:21 PM 02/13/2016    5:30 AM  CBC  WBC 4.0 - 10.5 K/uL 9.6  4.8  5.5   Hemoglobin 12.0 - 15.0 g/dL 30.8  65.7  8.6   Hematocrit 36.0 - 46.0 % 34.3  42.5  26.0   Platelets 150 - 400 K/uL 148  190  155      Most recent CMP    Latest Ref Rng & Units 06/21/2022    4:13 AM 06/20/2022   10:10 AM 06/07/2022    1:21 PM  CMP  Glucose 70 - 99 mg/dL 846  81  94   BUN 8 - 23 mg/dL 18  18  15    Creatinine 0.44 - 1.00 mg/dL 9.62  9.52  8.41   Sodium 135 - 145 mmol/L 131  136  139   Potassium 3.5 - 5.1 mmol/L 5.4  3.9  4.3   Chloride 98 - 111 mmol/L 103  104  106   CO2 22 - 32 mmol/L 23  26  28    Calcium 8.9 - 10.3 mg/dL 8.5  9.0  9.7   Total Protein 6.5 - 8.1 g/dL   7.3   Total Bilirubin 0.3 - 1.2 mg/dL   1.0   Alkaline Phos 38 - 126 U/L   79   AST 15 - 41 U/L   22   ALT 0 - 44 U/L   20     Renal function CrCl cannot be calculated (Patient's most recent lab result is older than the maximum 21 days allowed.).  No results found for: "HGBA1C"  LDL Chol Calc (NIH)  Date Value Ref Range Status  02/02/2021 187 (H) 0 - 99 mg/dL Final   LDL Direct   Date Value Ref Range Status  02/02/2021 182 (H) 0 - 99 mg/dL Final     Right Carotid: Velocities in the right ICA are consistent with a 1-39%  stenosis.   Left Carotid: Velocities in the left ICA are consistent with a 40-59%  stenosis.   Vertebrals: Bilateral vertebral arteries demonstrate antegrade flow.  Subclavians: Normal flow hemodynamics were seen in bilateral subclavian               arteries.   Rande Brunt. Lenell Antu, MD [facility] Phone Number: [phone] 11/27/2022 3:35 PM   Total time spent on preparing this encounter including chart review, data review, collecting history, examining the patient, coordinating care for this new patient, 45 minutes.  Portions of this report may have been transcribed using voice recognition software.  Every effort has been made to ensure accuracy; however, inadvertent computerized transcription errors may still be present.

## 2022-11-28 ENCOUNTER — Ambulatory Visit (HOSPITAL_COMMUNITY)
Admission: RE | Admit: 2022-11-28 | Discharge: 2022-11-28 | Disposition: A | Discharge status: Home / Self Care | Payer: BC Managed Care – PPO | Source: Ambulatory Visit | Attending: Vascular Surgery | Admitting: Vascular Surgery

## 2022-11-28 ENCOUNTER — Encounter: Payer: Self-pay | Admitting: Vascular Surgery

## 2022-11-28 ENCOUNTER — Ambulatory Visit: Payer: BC Managed Care – PPO | Admitting: Vascular Surgery

## 2022-11-28 VITALS — BP 116/64 | HR 56 | Temp 97.8°F | Resp 20 | Ht 62.0 in | Wt 174.0 lb

## 2022-11-28 DIAGNOSIS — I6523 Occlusion and stenosis of bilateral carotid arteries: Secondary | ICD-10-CM

## 2022-12-01 DIAGNOSIS — Z96642 Presence of left artificial hip joint: Secondary | ICD-10-CM | POA: Diagnosis not present

## 2022-12-01 DIAGNOSIS — M25562 Pain in left knee: Secondary | ICD-10-CM | POA: Diagnosis not present

## 2022-12-29 DIAGNOSIS — M25561 Pain in right knee: Secondary | ICD-10-CM | POA: Diagnosis not present

## 2022-12-29 DIAGNOSIS — M25562 Pain in left knee: Secondary | ICD-10-CM | POA: Diagnosis not present

## 2023-01-01 DIAGNOSIS — M810 Age-related osteoporosis without current pathological fracture: Secondary | ICD-10-CM | POA: Diagnosis not present

## 2023-01-22 ENCOUNTER — Other Ambulatory Visit: Payer: Self-pay | Admitting: Cardiology

## 2023-01-22 DIAGNOSIS — I10 Essential (primary) hypertension: Secondary | ICD-10-CM

## 2023-01-30 DIAGNOSIS — M810 Age-related osteoporosis without current pathological fracture: Secondary | ICD-10-CM | POA: Diagnosis not present

## 2023-01-30 DIAGNOSIS — Z79899 Other long term (current) drug therapy: Secondary | ICD-10-CM | POA: Diagnosis not present

## 2023-01-30 DIAGNOSIS — M1990 Unspecified osteoarthritis, unspecified site: Secondary | ICD-10-CM | POA: Diagnosis not present

## 2023-02-01 LAB — LAB REPORT - SCANNED: EGFR: 51

## 2023-02-06 ENCOUNTER — Other Ambulatory Visit: Payer: Self-pay | Admitting: Cardiology

## 2023-02-06 ENCOUNTER — Ambulatory Visit (HOSPITAL_BASED_OUTPATIENT_CLINIC_OR_DEPARTMENT_OTHER): Payer: BC Managed Care – PPO | Admitting: Family Medicine

## 2023-02-06 ENCOUNTER — Encounter (HOSPITAL_BASED_OUTPATIENT_CLINIC_OR_DEPARTMENT_OTHER): Payer: Self-pay | Admitting: Family Medicine

## 2023-02-06 VITALS — BP 144/65 | HR 56 | Ht 62.0 in | Wt 165.7 lb

## 2023-02-06 DIAGNOSIS — I10 Essential (primary) hypertension: Secondary | ICD-10-CM

## 2023-02-06 DIAGNOSIS — I6523 Occlusion and stenosis of bilateral carotid arteries: Secondary | ICD-10-CM

## 2023-02-06 DIAGNOSIS — E782 Mixed hyperlipidemia: Secondary | ICD-10-CM | POA: Diagnosis not present

## 2023-02-06 DIAGNOSIS — M810 Age-related osteoporosis without current pathological fracture: Secondary | ICD-10-CM | POA: Diagnosis not present

## 2023-02-06 DIAGNOSIS — Z7689 Persons encountering health services in other specified circumstances: Secondary | ICD-10-CM

## 2023-02-06 NOTE — Progress Notes (Signed)
New Patient Office Visit  Subjective    Patient ID: SOLIA FAHL, female    DOB: [birth year]  Age: 72 y.o. MRN: [id number]  Suzanne Barton is a 72 year-old female who presents to establish care. She has a history of hypertension, hyperlipidemia, pre-diabetes, osteoarthritis with recent total left hip arthroplasty October 2023, osteoporosis, history of hepatitis C in remission (1992), GERD, anemia, carotid artery stenosis 50-69 %, and memory difficulties-- due to sleep disturbance. She reports frequent falls due to her eyesight and shuffling gait.    PCPKatherine Roan, FNP with [facility]  HTN- aspirin, atenolol, spironolactone   HLD- ezetimibe-simvastatin  Allergies- zyrtec, Singulair,  GERD- PPI  Ortho- diagnosed with osteoporosis, she reports she is going to start injections for bone density   Neurology- [facility] 21/30. 10/04/2022 EEG performed- abnormal findings. Per neuro: "She had a 47-hour ambulatory EEG which showed frequent runs of rhythmic left temporal activity without evolution in frequency or amplitude, but some with spread diffusely, concerning for EEG pattern seen in the ictal-interictal continuum."  Vascular surgery- (asymptomatic) bilat carotid artery stenosis-- f/u in 1 year with carotid duplex  Meds: aspirin 81mg  PO QD & atorvastatin 40-80mg  PO QD (or other high intensity statin). Referred by Dr. Jacinto Halim to vascular   Outpatient Encounter Medications as of 02/06/2023  Medication Sig   aspirin EC 81 MG tablet Take 81 mg by mouth daily. Swallow whole.   atenolol (TENORMIN) 25 MG tablet Take 0.5 tablets (12.5 mg total) by mouth at bedtime.   Carboxymethylcellulose Sodium (REFRESH TEARS OP) Place 1-2 drops into both eyes daily as needed (dry eyes).   cetirizine (ZYRTEC) 10 MG tablet Take 10 mg by mouth daily.   ezetimibe-simvastatin (VYTORIN) 10-40 MG tablet TAKE ONE TABLET BY MOUTH ONCE DAILY AT 6:00 P.M.   methocarbamol (ROBAXIN) 500 MG tablet Take 1 tablet (500 mg total) by  mouth every 8 (eight) hours as needed for muscle spasms.   montelukast (SINGULAIR) 10 MG tablet Take 10 mg by mouth daily.   ondansetron (ZOFRAN) 4 MG tablet Take 1 tablet (4 mg total) by mouth every 8 (eight) hours as needed for nausea or vomiting.   pantoprazole (PROTONIX) 40 MG tablet Take 40 mg by mouth daily.   sennosides-docusate sodium (SENOKOT-S) 8.6-50 MG tablet Take 2 tablets by mouth daily.   spironolactone (ALDACTONE) 25 MG tablet TAKE ONE TABLET BY MOUTH EVERY MORNING   Vitamin D, Ergocalciferol, (DRISDOL) 1.25 MG (50000 UNIT) CAPS capsule Take 50,000 Units by mouth every Thursday.   [DISCONTINUED] cyanocobalamin (VITAMIN B12) 1000 MCG tablet Take 1,000 mcg by mouth daily. (Patient not taking: Reported on 02/06/2023)   No facility-administered encounter medications on file as of 02/06/2023.    Past Medical History:  Diagnosis Date   Allergy    Anemia    Arthritis    HIPS,KNEES   Blood transfusion    X 2   Femur fracture, right (HCC) 02/10/2016   GERD (gastroesophageal reflux disease)    Hepatitis C 1992   in remission   Hyperlipidemia    no meds   Hypertension    Neuromuscular disorder (HCC)    hiatal hernia   Pneumonia    Right patella fracture    Seasonal allergies    Thyroid disease    "MILD"    Past Surgical History:  Procedure Laterality Date   APPENDECTOMY     CHOLECYSTECTOMY     COLONOSCOPY  05/23/2022   JP   cyst removed     x3- under  right arm, x3- groin area- as a teenager   FEMUR IM NAIL Right 02/10/2016   Procedure: INTRAMEDULLARY (IM) NAIL FEMORAL;  Surgeon: Teryl Lucy, MD;  Location: [facility];  Service: Orthopedics;  Laterality: Right;   FRACTURE SURGERY  09/12/1971   right femur   FRACTURE SURGERY     left thumb   knee arthoscopy     x3   LIVER BIOPSY     x12   ORIF PATELLA Right 03/07/2019   Procedure: OPEN REDUCTION INTERNAL (ORIF) FIXATION PATELLA;  Surgeon: Teryl Lucy, MD;  Location: [facility];  Service:  Orthopedics;  Laterality: Right;   OVARIAN CYST SURGERY     PARTIAL HYSTERECTOMY     had partial first, then later had all female organs removed   SPINE SURGERY  11/13/1999   vertebrae fusion   TONSILLECTOMY AND ADENOIDECTOMY     TOTAL HIP ARTHROPLASTY Left 06/20/2022   Procedure: TOTAL HIP ARTHROPLASTY;  Surgeon: Teryl Lucy, MD;  Location: [facility];  Service: Orthopedics;  Laterality: Left;   TUBAL LIGATION      Family History  Problem Relation Age of Onset   Lung cancer Mother    Cancer Mother        lung and brain   Heart failure Father 41   COPD Sister    Lung cancer Sister    Fibromyalgia Sister    Colon cancer Neg Hx    Esophageal cancer Neg Hx    Stomach cancer Neg Hx    Rectal cancer Neg Hx    Colon polyps Neg Hx    Crohn's disease Neg Hx     Social History   Socioeconomic History   Marital status: Married    Spouse name: Not on file   Number of children: 1   Years of education: 13   Highest education level: Not on file  Occupational History   Occupation: home maker  Tobacco Use   Smoking status: Never    Passive exposure: Past (HUSBAND SMOKED 17 YEARS AGO)   Smokeless tobacco: Never   Tobacco comments:    husband smoked x 13 yrs  Vaping Use   Vaping Use: Never used  Substance and Sexual Activity   Alcohol use: Yes    Comment: Rare   Drug use: No   Sexual activity: Not on file  Other Topics Concern   Not on file  Social History Narrative   1 ADOPTED CHILD   Right handed   Lives husband   One story home   retired   Chief Executive Officer Determinants of Corporate investment banker Strain: Not on file  Food Insecurity: Not on file  Transportation Needs: Not on file  Physical Activity: Not on file  Stress: Not on file  Social Connections: Not on file  Intimate Partner Violence: Not on file    Review of Systems  Constitutional:  Negative for malaise/fatigue and weight loss.  Eyes:  Negative for blurred vision and double vision.  Respiratory:  Negative  for cough and shortness of breath.   Cardiovascular:  Negative for chest pain and palpitations.  Gastrointestinal:  Negative for abdominal pain, nausea and vomiting.  Genitourinary:  Negative for frequency and urgency.  Musculoskeletal:  Negative for myalgias.  Neurological:  Negative for dizziness, weakness and headaches.  Psychiatric/Behavioral:  Negative for depression, substance abuse and suicidal ideas. The patient is not nervous/anxious.    Objective    BP (!) 144/65   Pulse (!) 56   Ht 5\' 2"  (  1.575 m)   Wt 165 lb 11.2 oz (75.2 kg)   SpO2 100%   BMI 30.31 kg/m   Physical Exam Constitutional:      Appearance: Normal appearance.  Cardiovascular:     Rate and Rhythm: Normal rate and regular rhythm.     Pulses: Normal pulses.     Heart sounds: Normal heart sounds.  Pulmonary:     Effort: Pulmonary effort is normal.     Breath sounds: Normal breath sounds.  Neurological:     Mental Status: She is alert.  Psychiatric:        Mood and Affect: Mood normal.        Behavior: Behavior normal.        Thought Content: Thought content normal.        Judgment: Judgment normal.    Assessment & Plan:   1. Encounter to establish care Patient presents today to establish care for chronic medical conditions. She reports that she and her husband see an advance practice provider at [facility] currently. He is thinking about retiring but unsure when he is going to retire. She has no concerns to discuss today. I reviewed the past medical history, family history, social history, surgical history, and allergies today.  2. Age related osteoporosis, unspecified pathological fracture presence Followed by ortho. Patient reports she is seeing an orthopedic provider for osteoporotic injections.   3. Primary hypertension Patient presents with slightly elevated systolic blood pressure. Patient in no acute distress and is well-appearing. Denies chest pain, shortness of breath, lower extremity  edema, vision changes, headaches. Cardiovascular exam with heart regular rate and rhythm. Normal heart sounds, no murmurs present. No lower extremity edema present. Lungs clear to auscultation bilaterally. Patient is currently taking atenolol 25mg  daily and spironolactone 25mg  daily as prescribed. No refills needed.   4. Mixed hyperlipidemia Patient currently taking ezetimibe-simvastatin 10-40mg  daily as prescribed. She reports compliance and does not report medication side effects. Plan to review recent lipid panel- low threshold for medication management. No refills needed.   5. Bilateral carotid artery stenosis Followed annually by vascular surgery. Patient reports she is currently asymptomatic. Currently taking aspirin 81mg  daily. Plan to review recent lipid panel- low threshold for medication management.   Please call to schedule future appointments.    Alyson Reedy, FNP

## 2023-02-28 ENCOUNTER — Other Ambulatory Visit: Payer: BC Managed Care – PPO

## 2023-03-20 DIAGNOSIS — M810 Age-related osteoporosis without current pathological fracture: Secondary | ICD-10-CM | POA: Diagnosis not present

## 2023-04-11 DIAGNOSIS — H15111 Episcleritis periodica fugax, right eye: Secondary | ICD-10-CM | POA: Diagnosis not present

## 2023-04-27 DIAGNOSIS — M25561 Pain in right knee: Secondary | ICD-10-CM | POA: Diagnosis not present

## 2023-05-10 ENCOUNTER — Ambulatory Visit: Payer: BC Managed Care – PPO | Admitting: Physician Assistant

## 2023-05-24 DIAGNOSIS — R92323 Mammographic fibroglandular density, bilateral breasts: Secondary | ICD-10-CM | POA: Diagnosis not present

## 2023-05-24 DIAGNOSIS — Z1231 Encounter for screening mammogram for malignant neoplasm of breast: Secondary | ICD-10-CM | POA: Diagnosis not present

## 2023-06-09 IMAGING — CR DG RIBS W/ CHEST 3+V*L*
3 series · 3 of 3 positions shown · non-contrast
Comparison: February 10, 2016.

CLINICAL DATA: Left rib pain after fall.

EXAM:
LEFT RIBS AND CHEST - 3+ VIEW

[w chest pa *]
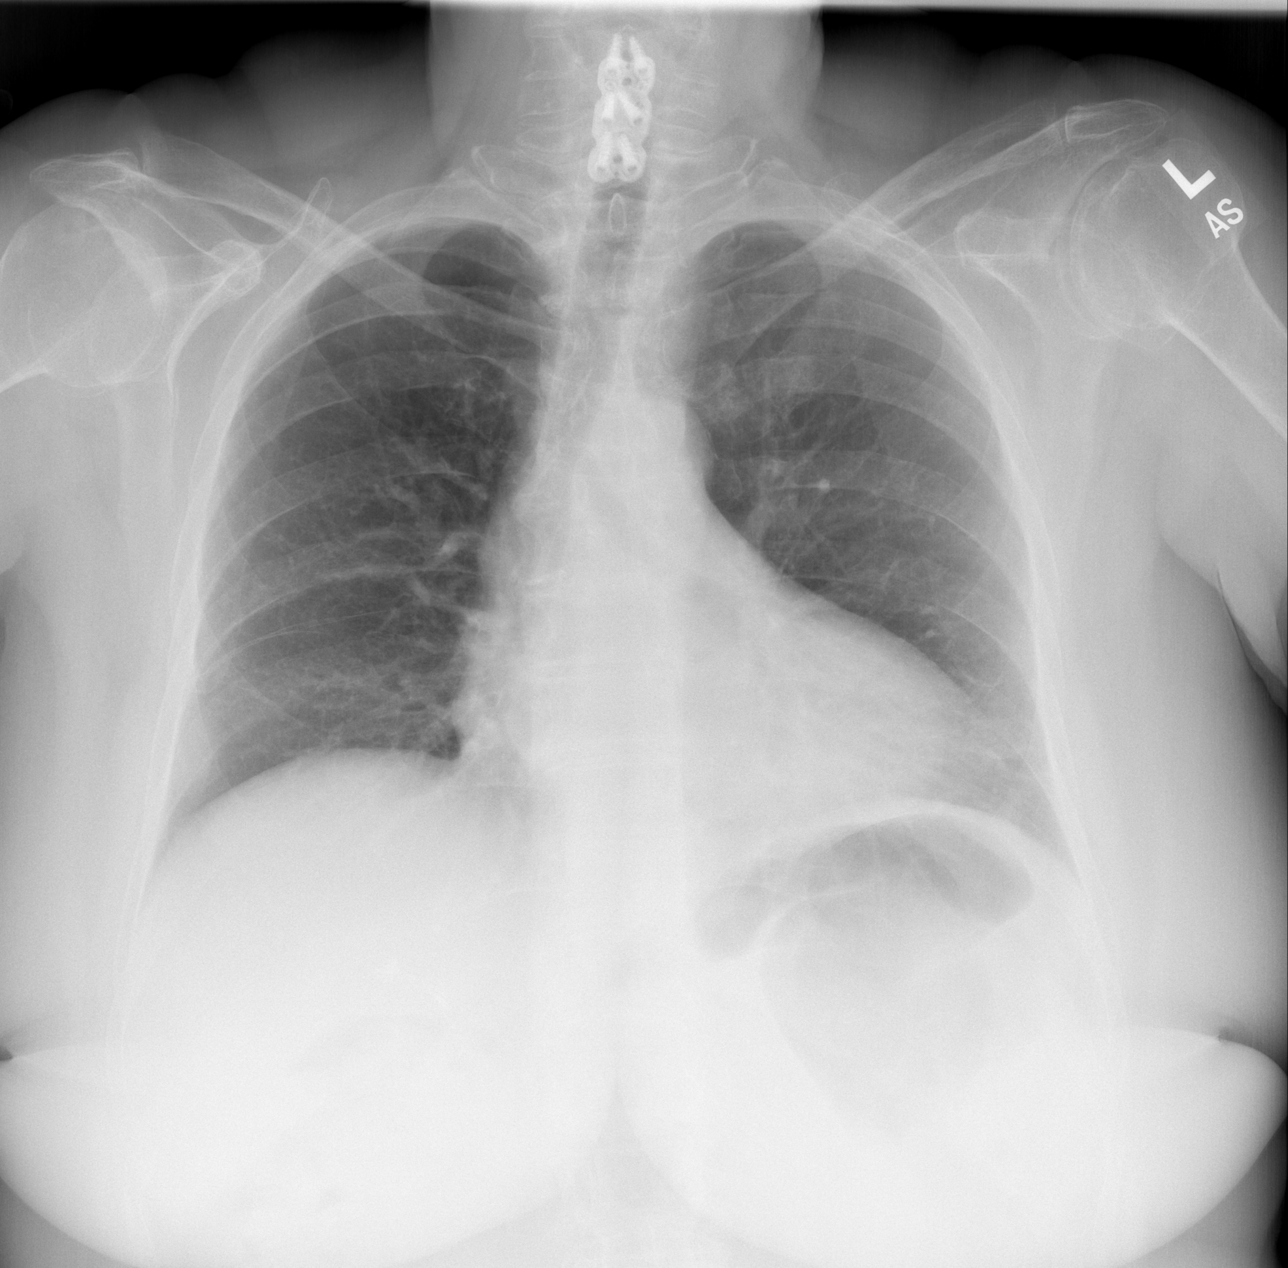

[w ribs ap/pa lower left *]
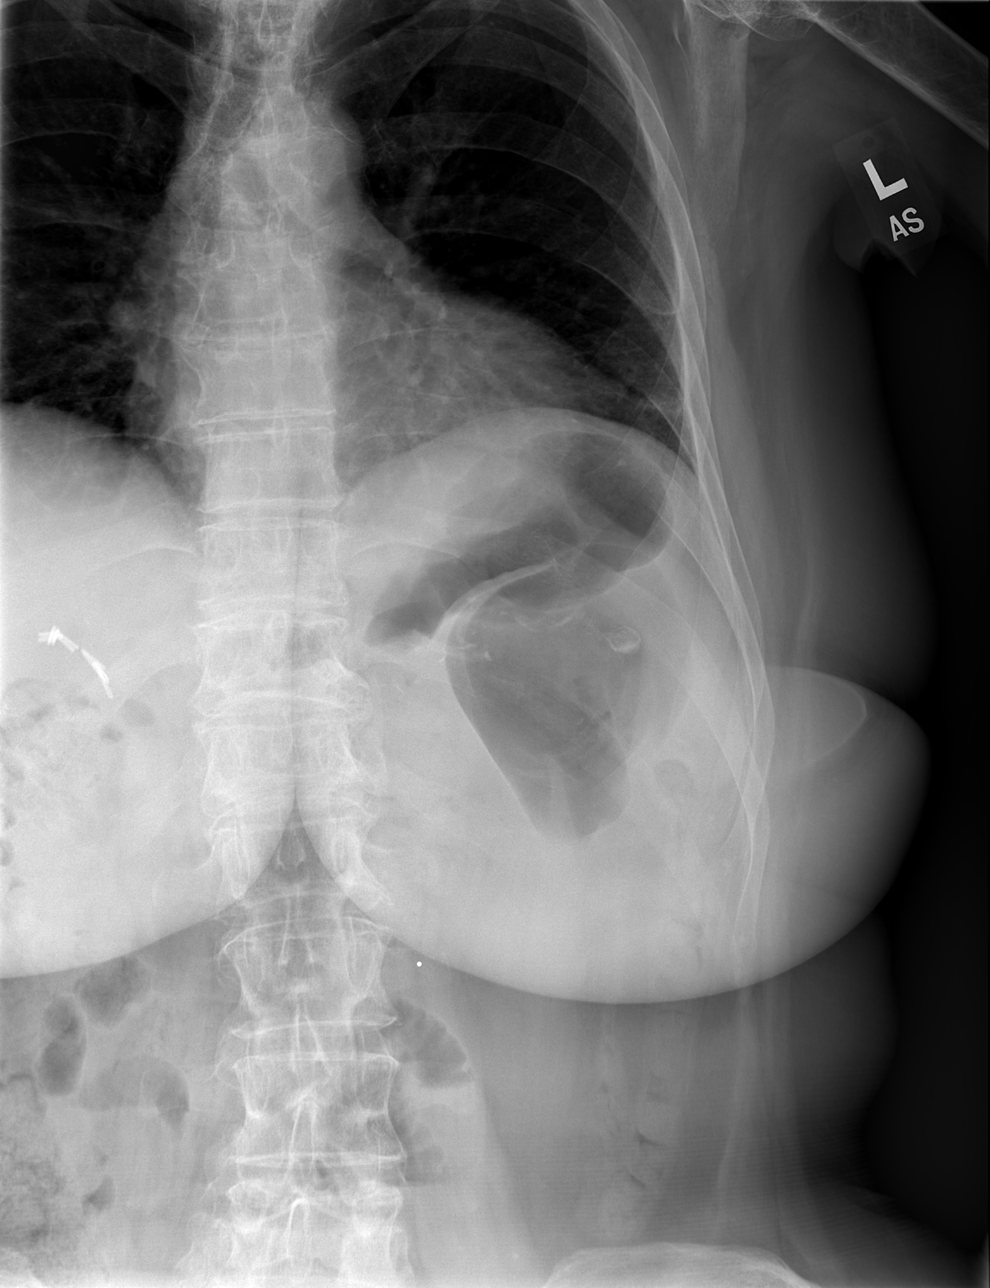

[w ribs oblique left *]
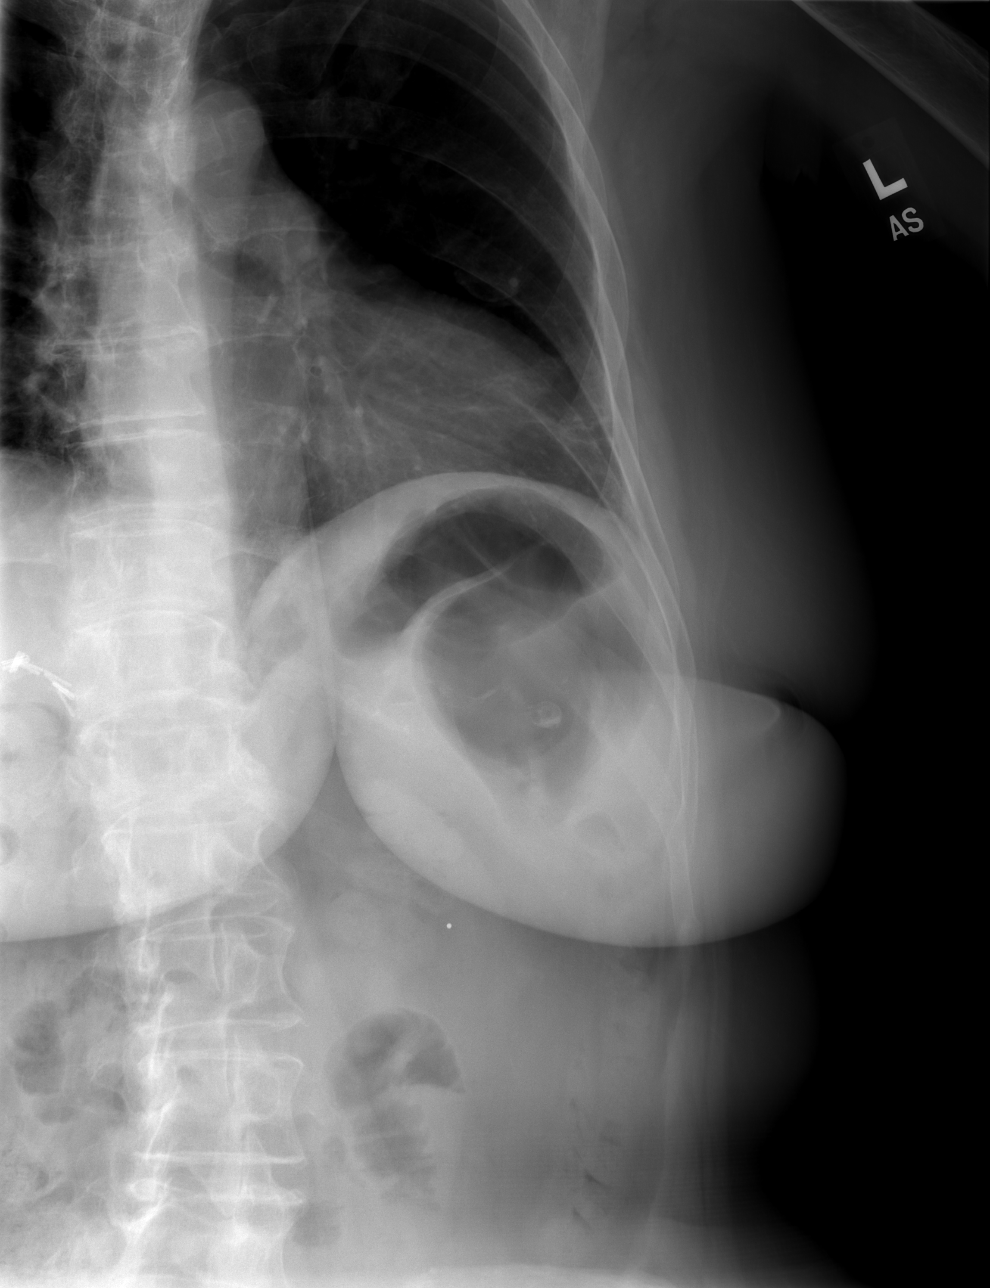

[3 of 3 positions shown; findings below may reference images not displayed]

FINDINGS: No fracture or other bone lesions are seen involving the ribs. There
is no evidence of pneumothorax or pleural effusion. Both lungs are
clear. Heart size and mediastinal contours are within normal limits.
IMPRESSION: Negative.

## 2023-07-02 DIAGNOSIS — M1611 Unilateral primary osteoarthritis, right hip: Secondary | ICD-10-CM | POA: Diagnosis not present

## 2023-07-23 ENCOUNTER — Other Ambulatory Visit: Payer: Self-pay

## 2023-07-23 DIAGNOSIS — I6523 Occlusion and stenosis of bilateral carotid arteries: Secondary | ICD-10-CM

## 2023-07-30 ENCOUNTER — Ambulatory Visit (HOSPITAL_COMMUNITY)
Admission: RE | Admit: 2023-07-30 | Discharge: 2023-07-30 | Disposition: A | Discharge status: Home / Self Care | Payer: BC Managed Care – PPO | Source: Ambulatory Visit | Attending: Vascular Surgery | Admitting: Vascular Surgery

## 2023-07-30 DIAGNOSIS — I6523 Occlusion and stenosis of bilateral carotid arteries: Secondary | ICD-10-CM | POA: Diagnosis not present

## 2023-08-02 ENCOUNTER — Encounter (HOSPITAL_BASED_OUTPATIENT_CLINIC_OR_DEPARTMENT_OTHER): Payer: Self-pay | Admitting: Family Medicine

## 2023-08-07 ENCOUNTER — Encounter (HOSPITAL_COMMUNITY): Payer: BC Managed Care – PPO

## 2023-08-07 ENCOUNTER — Ambulatory Visit: Payer: BC Managed Care – PPO | Admitting: Vascular Surgery

## 2023-09-03 DIAGNOSIS — M810 Age-related osteoporosis without current pathological fracture: Secondary | ICD-10-CM | POA: Diagnosis not present

## 2023-09-03 DIAGNOSIS — K219 Gastro-esophageal reflux disease without esophagitis: Secondary | ICD-10-CM | POA: Diagnosis not present

## 2023-09-03 DIAGNOSIS — M1990 Unspecified osteoarthritis, unspecified site: Secondary | ICD-10-CM | POA: Diagnosis not present

## 2023-09-03 DIAGNOSIS — Z79899 Other long term (current) drug therapy: Secondary | ICD-10-CM | POA: Diagnosis not present

## 2023-09-17 NOTE — Progress Notes (Signed)
 [facility] [place]  ASSESSMENT / PLAN: Suzanne Barton is a 73 y.o. female with bilateral carotid artery stenosis.   The patient should continue best medical therapy for carotid artery stenosis including: Complete cessation from all tobacco products. Blood glucose control with goal A1c < 7%. Blood pressure control with goal blood pressure < 140/90 mmHg. Lipid reduction therapy with goal LDL-C <100 mg/dL Aspirin  81mg  PO QD.  Atorvastatin 40-80mg  PO QD (or other high intensity statin therapy).  Will continue surveillance of carotid disease. Follow up with me in 1 year with carotid duplex.   CHIEF COMPLAINT: Carotid artery stenosis  HISTORY OF PRESENT ILLNESS: Suzanne Barton is a 73 y.o. female referred to clinic by Dr. Ladona for evaluation of bilateral carotid artery stenosis.  The patient is concerned because she will have momentary lapses in memory or word searching difficulty.  She is worried that her carotid artery lesions may be causing these problems.  The bulk of our visit was spent reviewing the natural history of carotid artery stenosis.  I explained to her that carotid artery stenosis can only cause problems I thromboembolism.  Her symptoms are not typical of dominant hemisphere middle cerebral transient ischemic attacks.  She has no other associated focal symptoms such as amaurosis, unilateral weakness or numbness, facial droop, dysarthria.  11/28/22: patient returns to review neurology evaluation. Possible seizure disorder. Patient leery about starting antiseizure medicine because of issues with fatigue. She is following with them closely. No new focal neurologic symptoms.   09/18/23: Doing well overall.  Patient has no complaints aside from easy bruising from atrophic skin and aspirin .  No strokes or mini strokes.  We reviewed her duplex.  Past Medical History:  Diagnosis Date   Allergy    Anemia    Arthritis    HIPS,KNEES   Blood transfusion     X 2   Femur fracture, right (HCC) 02/10/2016   GERD (gastroesophageal reflux disease)    Hepatitis C 1992   in remission   Hyperlipidemia    no meds   Hypertension    Neuromuscular disorder (HCC)    hiatal hernia   Pneumonia    Right patella fracture    Seasonal allergies    Thyroid disease    MILD    Past Surgical History:  Procedure Laterality Date   APPENDECTOMY     CHOLECYSTECTOMY     COLONOSCOPY  05/23/2022   JP   cyst removed     x3- under right arm, x3- groin area- as a teenager   FEMUR IM NAIL Right 02/10/2016   Procedure: INTRAMEDULLARY (IM) NAIL FEMORAL;  Surgeon: Fonda Olmsted, MD;  Location: [facility];  Service: Orthopedics;  Laterality: Right;   FRACTURE SURGERY  09/12/1971   right femur   FRACTURE SURGERY     left thumb   knee arthoscopy     x3   LIVER BIOPSY     x12   ORIF PATELLA Right 03/07/2019   Procedure: OPEN REDUCTION INTERNAL (ORIF) FIXATION PATELLA;  Surgeon: Olmsted Fonda, MD;  Location: [facility];  Service: Orthopedics;  Laterality: Right;   OVARIAN CYST SURGERY     PARTIAL HYSTERECTOMY     had partial first, then later had all female organs removed   SPINE SURGERY  11/13/1999   vertebrae fusion   TONSILLECTOMY AND ADENOIDECTOMY     TOTAL HIP ARTHROPLASTY Left 06/20/2022   Procedure: TOTAL HIP ARTHROPLASTY;  Surgeon: Olmsted Fonda, MD;  Location: [facility];  Service: Orthopedics;  Laterality: Left;   TUBAL LIGATION      Family History  Problem Relation Age of Onset   Lung cancer Mother    Cancer Mother        lung and brain   Heart failure Father 31   COPD Sister    Lung cancer Sister    Fibromyalgia Sister    Colon cancer Neg Hx    Esophageal cancer Neg Hx    Stomach cancer Neg Hx    Rectal cancer Neg Hx    Colon polyps Neg Hx    Crohn's disease Neg Hx     Social History   Socioeconomic History   Marital status: Married    Spouse name: Not on file   Number of children: 1   Years of education: 13    Highest education level: Not on file  Occupational History   Occupation: home maker  Tobacco Use   Smoking status: Never    Passive exposure: Past (HUSBAND SMOKED 17 YEARS AGO)   Smokeless tobacco: Never   Tobacco comments:    husband smoked x 13 yrs  Vaping Use   Vaping status: Never Used  Substance and Sexual Activity   Alcohol  use: Yes    Comment: Rare   Drug use: No   Sexual activity: Not on file  Other Topics Concern   Not on file  Social History Narrative   1 ADOPTED CHILD   Right handed   Lives husband   One story home   retired   Chief Executive Officer Drivers of Corporate Investment Banker Strain: Not on file  Food Insecurity: Not on file  Transportation Needs: Not on file  Physical Activity: Not on file  Stress: Not on file  Social Connections: Unknown (01/20/2022)   Received from [facility], [facility]   Social Network    Social Network: Not on file  Intimate Partner Violence: Unknown (12/13/2021)   Received [facility], [facility]   HITS    Physically Hurt: Not on file    Insult or Talk Down To: Not on file    Threaten Physical Harm: Not on file    Scream or Curse: Not on file    Allergies  Allergen Reactions   Losartan  Potassium-Hctz Shortness Of Breath    Rash   Tape     Redness    Tussionex Pennkinetic Er [Hydrocod Poli-Chlorphe Poli Er] Hives   Amoxicillin Rash    2 GM CEFAZOLIN  ADMINISTERED WITHOUT ANY DIFFICULTIES OR REACTIONS 02/10/2016 @ 1900   Ibuprofen Rash    Tolerates ASA   Sulfa Antibiotics Rash    Current Outpatient Medications  Medication Sig Dispense Refill   aspirin  EC 81 MG tablet Take 81 mg by mouth daily. Swallow whole.     atenolol  (TENORMIN ) 25 MG tablet Take 0.5 tablets (12.5 mg total) by mouth at bedtime. 15 tablet 3   Carboxymethylcellulose Sodium (REFRESH TEARS OP) Place 1-2 drops into both eyes daily as needed (dry eyes).     cetirizine (ZYRTEC) 10 MG tablet Take 10 mg by mouth daily.     ezetimibe -simvastatin   (VYTORIN ) 10-40 MG tablet TAKE ONE TABLET BY MOUTH ONCE DAILY AT 6:00 P.M. 90 tablet 0   methocarbamol  (ROBAXIN ) 500 MG tablet Take 1 tablet (500 mg total) by mouth every 8 (eight) hours as needed for muscle spasms. 30 tablet 0   montelukast  (SINGULAIR ) 10 MG tablet Take 10 mg by mouth daily.     ondansetron  (  ZOFRAN ) 4 MG tablet Take 1 tablet (4 mg total) by mouth every 8 (eight) hours as needed for nausea or vomiting. 10 tablet 0   pantoprazole  (PROTONIX ) 40 MG tablet Take 40 mg by mouth daily.     sennosides-docusate sodium  (SENOKOT-S) 8.6-50 MG tablet Take 2 tablets by mouth daily. 30 tablet 1   spironolactone  (ALDACTONE ) 25 MG tablet TAKE ONE TABLET BY MOUTH EVERY MORNING 30 tablet 2   Vitamin D, Ergocalciferol, (DRISDOL) 1.25 MG (50000 UNIT) CAPS capsule Take 50,000 Units by mouth every Thursday.     No current facility-administered medications for this visit.    PHYSICAL EXAM There were no vitals filed for this visit.  Well-appearing elderly woman in no acute distress Regular rate and rhythm Unlabored breathing Easily palpable radial pulses  PERTINENT LABORATORY AND RADIOLOGIC DATA  Most recent CBC    Latest Ref Rng & Units 06/21/2022    4:13 AM 06/07/2022    1:21 PM 02/13/2016    5:30 AM  CBC  WBC 4.0 - 10.5 K/uL 9.6  4.8  5.5   Hemoglobin 12.0 - 15.0 g/dL 88.4  85.6  8.6   Hematocrit 36.0 - 46.0 % 34.3  42.5  26.0   Platelets 150 - 400 K/uL 148  190  155      Most recent CMP    Latest Ref Rng & Units 06/21/2022    4:13 AM 06/20/2022   10:10 AM 06/07/2022    1:21 PM  CMP  Glucose 70 - 99 mg/dL 869  81  94   BUN 8 - 23 mg/dL 18  18  15    Creatinine 0.44 - 1.00 mg/dL 8.86  8.74  8.85   Sodium 135 - 145 mmol/L 131  136  139   Potassium 3.5 - 5.1 mmol/L 5.4  3.9  4.3   Chloride 98 - 111 mmol/L 103  104  106   CO2 22 - 32 mmol/L 23  26  28    Calcium 8.9 - 10.3 mg/dL 8.5  9.0  9.7   Total Protein 6.5 - 8.1 g/dL   7.3   Total Bilirubin 0.3 - 1.2 mg/dL   1.0   Alkaline  Phos 38 - 126 U/L   79   AST 15 - 41 U/L   22   ALT 0 - 44 U/L   20     Renal function CrCl cannot be calculated (Patient's most recent lab result is older than the maximum 21 days allowed.).  No results found for: HGBA1C  LDL Chol Calc (NIH)  Date Value Ref Range Status  02/02/2021 187 (H) 0 - 99 mg/dL Final   LDL Direct  Date Value Ref Range Status  02/02/2021 182 (H) 0 - 99 mg/dL Final    Right Carotid: Velocities in the right ICA are consistent with a 1-39%  stenosis.   Left Carotid: Velocities in the left ICA are consistent with a 40-59%  stenosis,               lower end of range.   Vertebrals:  Bilateral vertebral arteries demonstrate antegrade flow.  Subclavians: Normal flow hemodynamics were seen in bilateral subclavian               arteries.   Debby SAILOR. Magda, MD [facility] Phone Number: [phone] 09/17/2023 10:26 AM   Total time spent on preparing this encounter including chart review, data review, collecting history, examining the patient, coordinating care  for this established patient, 20 minutes  Portions of this report may have been transcribed using voice recognition software.  Every effort has been made to ensure accuracy; however, inadvertent computerized transcription errors may still be present.

## 2023-09-18 ENCOUNTER — Ambulatory Visit: Payer: BC Managed Care – PPO | Admitting: Vascular Surgery

## 2023-09-18 ENCOUNTER — Encounter: Payer: Self-pay | Admitting: Vascular Surgery

## 2023-09-18 VITALS — BP 160/73 | HR 64 | Temp 97.9°F | Resp 20 | Ht 62.0 in | Wt 155.0 lb

## 2023-09-18 DIAGNOSIS — I6523 Occlusion and stenosis of bilateral carotid arteries: Secondary | ICD-10-CM | POA: Diagnosis not present

## 2023-09-28 ENCOUNTER — Other Ambulatory Visit: Payer: Self-pay

## 2023-09-28 DIAGNOSIS — R0781 Pleurodynia: Secondary | ICD-10-CM | POA: Diagnosis not present

## 2023-09-28 DIAGNOSIS — I6523 Occlusion and stenosis of bilateral carotid arteries: Secondary | ICD-10-CM

## 2023-10-12 DIAGNOSIS — R0781 Pleurodynia: Secondary | ICD-10-CM | POA: Diagnosis not present

## 2023-10-24 DIAGNOSIS — M25552 Pain in left hip: Secondary | ICD-10-CM | POA: Diagnosis not present

## 2023-10-24 DIAGNOSIS — S8002XA Contusion of left knee, initial encounter: Secondary | ICD-10-CM | POA: Diagnosis not present

## 2023-10-24 DIAGNOSIS — M25561 Pain in right knee: Secondary | ICD-10-CM | POA: Diagnosis not present

## 2023-12-17 DIAGNOSIS — M25561 Pain in right knee: Secondary | ICD-10-CM | POA: Diagnosis not present

## 2024-01-21 ENCOUNTER — Encounter (HOSPITAL_BASED_OUTPATIENT_CLINIC_OR_DEPARTMENT_OTHER): Payer: Self-pay | Admitting: Emergency Medicine

## 2024-01-21 ENCOUNTER — Emergency Department (HOSPITAL_BASED_OUTPATIENT_CLINIC_OR_DEPARTMENT_OTHER)
Admission: EM | Admit: 2024-01-21 | Discharge: 2024-01-21 | Disposition: A | Discharge status: Home / Self Care | Attending: Emergency Medicine | Admitting: Emergency Medicine

## 2024-01-21 ENCOUNTER — Other Ambulatory Visit: Payer: Self-pay

## 2024-01-21 ENCOUNTER — Emergency Department (HOSPITAL_BASED_OUTPATIENT_CLINIC_OR_DEPARTMENT_OTHER)

## 2024-01-21 ENCOUNTER — Emergency Department (HOSPITAL_BASED_OUTPATIENT_CLINIC_OR_DEPARTMENT_OTHER): Admitting: Radiology

## 2024-01-21 DIAGNOSIS — W19XXXA Unspecified fall, initial encounter: Secondary | ICD-10-CM | POA: Insufficient documentation

## 2024-01-21 DIAGNOSIS — S81812A Laceration without foreign body, left lower leg, initial encounter: Secondary | ICD-10-CM | POA: Insufficient documentation

## 2024-01-21 DIAGNOSIS — S61412A Laceration without foreign body of left hand, initial encounter: Secondary | ICD-10-CM | POA: Insufficient documentation

## 2024-01-21 DIAGNOSIS — Z7982 Long term (current) use of aspirin: Secondary | ICD-10-CM | POA: Insufficient documentation

## 2024-01-21 DIAGNOSIS — M1812 Unilateral primary osteoarthritis of first carpometacarpal joint, left hand: Secondary | ICD-10-CM | POA: Diagnosis not present

## 2024-01-21 DIAGNOSIS — M79642 Pain in left hand: Secondary | ICD-10-CM | POA: Diagnosis not present

## 2024-01-21 DIAGNOSIS — Z043 Encounter for examination and observation following other accident: Secondary | ICD-10-CM | POA: Diagnosis not present

## 2024-01-21 MED ORDER — LIDOCAINE-EPINEPHRINE-TETRACAINE (LET) TOPICAL GEL
3.0000 mL | Freq: Once | TOPICAL | Status: DC
  Filled 2024-01-21: qty 3

## 2024-01-21 MED ORDER — OXYCODONE-ACETAMINOPHEN 5-325 MG PO TABS
1.0000 | ORAL_TABLET | Freq: Once | ORAL | Status: AC
  Administered 2024-01-21: 1 via ORAL
  Filled 2024-01-21: qty 1

## 2024-01-21 MED ORDER — OXYCODONE HCL 5 MG PO TABS: 5.0000 mg | ORAL_TABLET | Freq: Three times a day (TID) | ORAL | 0 refills | Status: AC | PRN

## 2024-01-21 MED ORDER — CEPHALEXIN 500 MG PO CAPS: 500.0000 mg | ORAL_CAPSULE | Freq: Two times a day (BID) | ORAL | 0 refills | Status: AC

## 2024-01-21 MED ORDER — LIDOCAINE 4 % EX CREA
TOPICAL_CREAM | Freq: Once | CUTANEOUS | Status: AC
  Filled 2024-01-21: qty 5

## 2024-01-21 MED ORDER — LIDOCAINE-EPINEPHRINE (PF) 2 %-1:200000 IJ SOLN
10.0000 mL | Freq: Once | INTRAMUSCULAR | Status: AC
  Administered 2024-01-21: 10 mL
  Filled 2024-01-21: qty 20

## 2024-01-21 NOTE — ED Triage Notes (Signed)
 Mechanical fall this morning. Denies hitting head. Takes asa. Skin tears to left wrist and left leg.

## 2024-01-21 NOTE — ED Notes (Signed)
 Discharge paperwork given and verbally understood.

## 2024-01-21 NOTE — ED Provider Notes (Signed)
 [place] EMERGENCY DEPARTMENT AT [facility] Provider Note   CSN: [id number] Arrival date & time: 01/21/24  1359     History  Chief Complaint  Patient presents with   Suzanne Barton is a 73 y.o. female presented to ED with a mechanical fall today and abrasion to her left palm and her left lower leg, scraping on the concrete.  She takes a baby aspirin  daily.  Denies head injury or loss of consciousness.  She tried to go to urgent care but so she needs come to ED for repair of her wound.  Her tetanus is up-to-date as of last year by her PCP.  She is here with her son at the bedside  HPI     Home Medications Prior to Admission medications   Medication Sig Start Date End Date Taking? Authorizing Provider  cephALEXin (KEFLEX) 500 MG capsule Take 1 capsule (500 mg total) by mouth 2 (two) times daily for 5 days. 01/21/24 01/26/24 Yes Mariany Mackintosh, Janalyn Me, MD  oxyCODONE  (ROXICODONE ) 5 MG immediate release tablet Take 1 tablet (5 mg total) by mouth every 8 (eight) hours as needed for up to 8 doses for severe pain (pain score 7-10). 01/21/24  Yes Arvilla Birmingham, MD  aspirin  EC 81 MG tablet Take 81 mg by mouth daily. Swallow whole.    [provider]  atenolol  (TENORMIN ) 25 MG tablet Take 0.5 tablets (12.5 mg total) by mouth at bedtime. 01/25/23   Knox Perl, MD  Carboxymethylcellulose Sodium (REFRESH TEARS OP) Place 1-2 drops into both eyes daily as needed (dry eyes).    [provider]  cetirizine (ZYRTEC) 10 MG tablet Take 10 mg by mouth daily.    [provider]  ezetimibe -simvastatin  (VYTORIN ) 10-40 MG tablet TAKE ONE TABLET BY MOUTH ONCE DAILY AT 6:00 P.M. 09/12/22   Knox Perl, MD  methocarbamol  (ROBAXIN ) 500 MG tablet Take 1 tablet (500 mg total) by mouth every 8 (eight) hours as needed for muscle spasms. 06/20/22   Brown, Blaine K, PA-C  montelukast  (SINGULAIR ) 10 MG tablet Take 10 mg by mouth daily.    [provider]  pantoprazole   (PROTONIX ) 40 MG tablet Take 40 mg by mouth daily.    [provider]  sennosides-docusate sodium  (SENOKOT-S) 8.6-50 MG tablet Take 2 tablets by mouth daily. 06/20/22   Brown, Blaine K, PA-C  spironolactone  (ALDACTONE ) 25 MG tablet TAKE ONE TABLET BY MOUTH EVERY MORNING 10/23/22   Knox Perl, MD  Vitamin D, Ergocalciferol, (DRISDOL) 1.25 MG (50000 UNIT) CAPS capsule Take 50,000 Units by mouth every Thursday.    [provider]      Allergies    Losartan  potassium-hctz, Tape, Tussionex pennkinetic er [hydrocod poli-chlorphe poli er], Amoxicillin, Ibuprofen, and Sulfa antibiotics    Review of Systems   Review of Systems  Physical Exam Updated Vital Signs BP (!) 152/54 (BP Location: Right Arm)   Pulse 60   Temp 98 F (36.7 C)   Resp 18   SpO2 98%  Physical Exam Constitutional:      General: She is not in acute distress. HENT:     Head: Normocephalic and atraumatic.  Eyes:     Conjunctiva/sclera: Conjunctivae normal.     Pupils: Pupils are equal, round, and reactive to light.  Cardiovascular:     Rate and Rhythm: Normal rate and regular rhythm.  Pulmonary:     Effort: Pulmonary effort is normal. No respiratory distress.  Skin:    General:  Skin is warm and dry.     Comments: Small 1.5 cm curvilinear laceration to the base of the left palm, without active bleeding Larger 8 inch curvilinear superficial laceration to the left lower leg over the anterior leg, through the superficial fat, but does not violate the muscle or tendon, no obvious contamination, no active bleeding  Neurological:     General: No focal deficit present.     Mental Status: She is alert. Mental status is at baseline.  Psychiatric:        Mood and Affect: Mood normal.        Behavior: Behavior normal.     ED Results / Procedures / Treatments   Labs (all labs ordered are listed, but only abnormal results are displayed) Labs Reviewed - No data to display  EKG None  Radiology DG  Tibia/Fibula Left Result Date: 01/21/2024 CLINICAL DATA:  Status post fall EXAM: LEFT TIBIA AND FIBULA - 2 VIEW COMPARISON:  None Available. FINDINGS: There is no evidence of fracture or other focal bone lesions. Soft tissues are unremarkable. IMPRESSION: Negative. Electronically Signed   By: Fredrich Jefferson M.D.   On: 01/21/2024 15:31   DG Hand Complete Left Result Date: 01/21/2024 CLINICAL DATA:  Hand pain EXAM: LEFT HAND - COMPLETE 3+ VIEW COMPARISON:  None Available. FINDINGS: No fractures. Degenerative changes involving particularly the proximal interphalangeal joint of the third digit with a 4 mm subchondral cyst. Osteoarthrosis of the first carpometacarpal joint with large subchondral cysts of the multangular. And periarticular calcifications IMPRESSION: Osteoarthrosis as described without fractures Electronically Signed   By: Fredrich Jefferson M.D.   On: 01/21/2024 15:31    Procedures .Laceration Repair  Date/Time: 01/21/2024 7:30 PM  Performed by: Arvilla Birmingham, MD Authorized by: Arvilla Birmingham, MD   Consent:    Consent obtained:  Verbal   Consent given by:  Patient   Risks discussed:  Infection, retained foreign body, pain, poor cosmetic result and poor wound healing Universal protocol:    Procedure explained and questions answered to patient or proxy's satisfaction: yes     Relevant documents present and verified: yes     Test results available: yes     Imaging studies available: yes     Required blood products, implants, devices, and special equipment available: yes     Site/side marked: yes     Immediately prior to procedure, a time out was called: yes     Patient identity confirmed:  Arm band Anesthesia:    Anesthesia method:  Local infiltration and topical application   Topical anesthetic:  EMLA cream   Local anesthetic:  Lidocaine  1% WITH epi Laceration details:    Location:  Leg   Leg location:  L lower leg   Length (cm):  20 Pre-procedure details:    Preparation:   Patient was prepped and draped in usual sterile fashion and imaging obtained to evaluate for foreign bodies Exploration:    Hemostasis achieved with:  Epinephrine  and LET   Imaging obtained: x-ray     Imaging outcome: foreign body not noted     Wound exploration: wound explored through full range of motion and entire depth of wound visualized     Wound extent: areolar tissue not violated, fascia not violated, no foreign body, no signs of injury, no nerve damage, no tendon damage, no underlying fracture and no vascular damage     Contaminated: no   Treatment:    Area cleansed with:  Saline   Amount  of cleaning:  Standard   Irrigation solution:  Sterile saline Skin repair:    Repair method:  Sutures   Suture size:  3-0   Suture material:  Prolene   Suture technique:  Simple interrupted   Number of sutures:  21 Approximation:    Approximation:  Close Repair type:    Repair type:  Complex Post-procedure details:    Dressing:  Non-adherent dressing   Procedure completion:  Tolerated well, no immediate complications     Medications Ordered in ED Medications  oxyCODONE -acetaminophen  (PERCOCET/ROXICET) 5-325 MG per tablet 1 tablet (1 tablet Oral Given 01/21/24 1805)  lidocaine -EPINEPHrine  (XYLOCAINE  W/EPI) 2 %-1:200000 (PF) injection 10 mL (10 mLs Infiltration Given by Other 01/21/24 1923)  lidocaine  (LMX) 4 % cream ( Topical Given by Other 01/21/24 1924)    ED Course/ Medical Decision Making/ A&P                                 Medical Decision Making Risk OTC drugs. Prescription drug management.   Tetanus is up-to-date.  The patient's wounds were cleaned and explained to the full range of motion, no evident contamination.  I reviewed her x-rays from triage which do not show evidence of retained foreign body underlying fracture.  Wound repaired as noted above. Dermabond to superficial abrasion of left palm. She is stable for discharge        Final Clinical Impression(s) /  ED Diagnoses Final diagnoses:  Laceration of left lower extremity, initial encounter  Laceration of left palm, initial encounter    Rx / DC Orders ED Discharge Orders          Ordered    oxyCODONE  (ROXICODONE ) 5 MG immediate release tablet  Every 8 hours PRN        01/21/24 1930    cephALEXin (KEFLEX) 500 MG capsule  2 times daily        01/21/24 1930              Arvilla Birmingham, MD 01/21/24 1932

## 2024-01-21 NOTE — Discharge Instructions (Addendum)
 Your stitches need to be removed in 10-14 days.  This can be done at any medical clinic.

## 2024-03-03 DIAGNOSIS — K219 Gastro-esophageal reflux disease without esophagitis: Secondary | ICD-10-CM | POA: Diagnosis not present

## 2024-03-03 DIAGNOSIS — M1990 Unspecified osteoarthritis, unspecified site: Secondary | ICD-10-CM | POA: Diagnosis not present

## 2024-03-03 DIAGNOSIS — M810 Age-related osteoporosis without current pathological fracture: Secondary | ICD-10-CM | POA: Diagnosis not present

## 2024-03-03 DIAGNOSIS — Z79899 Other long term (current) drug therapy: Secondary | ICD-10-CM | POA: Diagnosis not present

## 2024-03-24 ENCOUNTER — Ambulatory Visit (HOSPITAL_BASED_OUTPATIENT_CLINIC_OR_DEPARTMENT_OTHER): Admitting: Family Medicine

## 2024-03-24 DIAGNOSIS — M810 Age-related osteoporosis without current pathological fracture: Secondary | ICD-10-CM | POA: Diagnosis not present

## 2024-04-22 ENCOUNTER — Ambulatory Visit (HOSPITAL_BASED_OUTPATIENT_CLINIC_OR_DEPARTMENT_OTHER): Admitting: Family Medicine

## 2024-04-28 DIAGNOSIS — M25562 Pain in left knee: Secondary | ICD-10-CM | POA: Diagnosis not present

## 2024-04-28 DIAGNOSIS — M25561 Pain in right knee: Secondary | ICD-10-CM | POA: Diagnosis not present

## 2024-06-03 DIAGNOSIS — R92323 Mammographic fibroglandular density, bilateral breasts: Secondary | ICD-10-CM | POA: Diagnosis not present

## 2024-06-03 DIAGNOSIS — Z1231 Encounter for screening mammogram for malignant neoplasm of breast: Secondary | ICD-10-CM | POA: Diagnosis not present

## 2024-07-08 DIAGNOSIS — R052 Subacute cough: Secondary | ICD-10-CM | POA: Diagnosis not present

## 2024-10-01 ENCOUNTER — Other Ambulatory Visit: Payer: Self-pay

## 2024-10-01 DIAGNOSIS — I6523 Occlusion and stenosis of bilateral carotid arteries: Secondary | ICD-10-CM

## 2024-10-21 ENCOUNTER — Ambulatory Visit (HOSPITAL_COMMUNITY)

## 2024-10-21 ENCOUNTER — Ambulatory Visit (HOSPITAL_COMMUNITY): Admitting: Vascular Surgery
